# Patient Record
Sex: Female | Born: 1950 | Race: White | Hispanic: No | Marital: Married
Health system: Midwestern US, Community
[De-identification: ages and names within clinical notes are randomized; demographics above are authoritative.]

## PROBLEM LIST (undated history)

## (undated) DIAGNOSIS — Z1239 Encounter for other screening for malignant neoplasm of breast: Secondary | ICD-10-CM

## (undated) DIAGNOSIS — R922 Inconclusive mammogram: Secondary | ICD-10-CM

## (undated) DIAGNOSIS — J4520 Mild intermittent asthma, uncomplicated: Secondary | ICD-10-CM

## (undated) DIAGNOSIS — I10 Essential (primary) hypertension: Secondary | ICD-10-CM

## (undated) DIAGNOSIS — Z139 Encounter for screening, unspecified: Secondary | ICD-10-CM

## (undated) DIAGNOSIS — Z13820 Encounter for screening for osteoporosis: Secondary | ICD-10-CM

## (undated) DIAGNOSIS — R0600 Dyspnea, unspecified: Secondary | ICD-10-CM

## (undated) DIAGNOSIS — E7800 Pure hypercholesterolemia, unspecified: Secondary | ICD-10-CM

## (undated) DIAGNOSIS — M810 Age-related osteoporosis without current pathological fracture: Secondary | ICD-10-CM

## (undated) DIAGNOSIS — Z1231 Encounter for screening mammogram for malignant neoplasm of breast: Secondary | ICD-10-CM

## (undated) DIAGNOSIS — F419 Anxiety disorder, unspecified: Secondary | ICD-10-CM

## (undated) DIAGNOSIS — R0602 Shortness of breath: Secondary | ICD-10-CM

## (undated) DIAGNOSIS — R42 Dizziness and giddiness: Secondary | ICD-10-CM

---

## 2013-01-17 ENCOUNTER — Encounter

## 2013-03-11 NOTE — Progress Notes (Signed)
03/11/2013     Patient:  Amy Cervantes   DOB:  [birth year]       CHIEF COMPLAINT:   Chief Complaint   Patient presents with   ??? Hypertension   ??? Irregular Heart Beat         HISTORY OF PRESENT ILLNESS: Amy Cervantes presents for cardiology follow up. Palpitation with near syncopal sensation.  Recently found to have labile hypertension..  Dyslipidemia    PAST MEDICAL HISTORY:  Past Medical History   Diagnosis Date   ??? Hypertension    ??? Hyperlipidemia    ??? Palpitation    ??? Near syncope         PAST SURGICAL HISTORY:  History reviewed. No pertinent past surgical history.    MEDICATIONS:  Current Outpatient Prescriptions   Medication Sig Dispense Refill   ??? Dexlansoprazole (DEXILANT) 60 mg CpDB Take  by mouth.            ALLERGIES:   Allergies   Allergen Reactions   ??? Levaquin [Levofloxacin] Other (comments)   ??? Sulfur Other (comments)       SOCIAL HISTORY:  History     Social History   ??? Marital Status: UNKNOWN     Spouse Name: N/A     Number of Children: N/A   ??? Years of Education: N/A     Social History Main Topics   ??? Smoking status: Former Smoker   ??? Smokeless tobacco: Not on file   ??? Alcohol Use: Not on file   ??? Drug Use: Not on file   ??? Sexually Active: Not on file     Other Topics Concern   ??? Not on file     Social History Narrative   ??? No narrative on file         FAMILY HISTORY:  Family History   Problem Relation Age of Onset   ??? Breast Cancer Paternal Grandmother        REVIEW OF SYSTEMS:  Neuro: No headache, focal weakness/numbness, parasthesias  Eyes: No visual disturbance  ENMT: No soar throat, nasal congestion, hearing loss, tinnitus  CV: No chest pain, palpitations, dizziness, syncope, claudication  Resp: No dyspnea, cough  GI: No abdominal pain, N/V, diarrhea, constipation  GU: No hematuria, dysuria  Skin: No rash  Heme: No bleeding, bruising  Constitutional: No fatigue, weakness, fever, weight change  Musculoskeletal: No myalgias, arthralgias  Other:    PHYSICAL EXAM:  Blood pressure 154/90, pulse 72, height  5\' 2"  (1.575 m), weight 145 lb (65.772 kg), SpO2 95.00%.   General: Well developed, well nourished, no acute distress, appears stated age  Eyes: Anicteric, no hemorrhage, normal appearing conjunctivae and lids  ENMT: Normal externally  Neck: Supple, no JVD, no carotid bruit  CV: RRR, normal S1S2, no M/G/R  Lungs: CTA bilaterally, normal respiratory excursion  Abd: Soft, NT, ND, no palpable mass  Extremities: No pedal edema  Skin: Warm and dry  Musculoskeletal: Normal muscle tone, normal gait  Neuro: Nonfocal exam, no rigidity/tremor  Psych: Alert and oriented, appropriate affect      LABS AND TESTING:  ?? EKG from March 08, 2013 showed regular sinus rhythm, normal EKG  ?? Labs: from 03/08/2013, done at the emergency room showed creatinine 0.69, BUN of 20, potassium 4.3.  Troponin was normal.  Hemoglobin 13.5.  White count was normal.  TSH 1.335, magnesium 1.9  ??     ASSESSMENT AND PLAN:  62 year old physically very active lady with no significant medical issues  after exercising for a prolonged period of time, developed rapid heartbeat about 190 bpm theophylline rapid heartbeat lasted some 20 minutes and she felt lightheaded, near syncopal sensation without actually fainting.  Eventually she ended up in the emergency room for further evaluation and all workup was negative.  I reviewed the records.  There was no chest pain reported.  There was no wheezing reported, and  Her pulse was rregular.    Central get a blood pressure machine and check her blood pressure regularly at home and record its I will also obtain an echocardiogram to evaluate left ventricular function.  Her cardiac exam is unremarkable.  His second blood pressure reading is down to 130/90 and she watches her salt intake and does not take over-the-counter medication.      Warnell Bureau, MD

## 2013-03-11 NOTE — Patient Instructions (Signed)
MyChart Activation    Thank you for requesting access to MyChart. Please follow the instructions below to securely access and download your online medical record. MyChart allows you to send messages to your doctor, view your test results, renew your prescriptions, schedule appointments, and more.    How Do I Sign Up?    1. In your internet browser, go to [email or web address]  2. Click on the First Time User? Click Here link in the Sign In box. You will be redirect to the New Member Sign Up page.  3. Enter your MyChart Access Code exactly as it appears below. You will not need to use this code after you???ve completed the sign-up process. If you do not sign up before the expiration date, you must request a new code.    MyChart Access Code: [email or web address]  Expires: 04/17/2013  1:20 PM (This is the date your MyChart access code will expire)    4. Enter the last four digits of your Social Security Number (xxxx) and Date of Birth (mm/dd/yyyy) as indicated and click Submit. You will be taken to the next sign-up page.  5. Create a MyChart ID. This will be your MyChart login ID and cannot be changed, so think of one that is secure and easy to remember.  6. Create a MyChart password. You can change your password at any time.  7. Enter your Password Reset Question and Answer. This can be used at a later time if you forget your password.   8. Enter your e-mail address. You will receive e-mail notification when new information is available in MyChart.  9. Click Sign Up. You can now view and download portions of your medical record.  10. Click the Download Summary menu link to download a portable copy of your medical information.    Additional Information    If you have questions, please visit the Frequently Asked Questions section of the MyChart website at [email or web address] Remember, MyChart is NOT to be used for urgent needs. For medical emergencies, dial 911.

## 2013-03-18 NOTE — Patient Instructions (Signed)
MyChart Activation    Thank you for requesting access to MyChart. Please follow the instructions below to securely access and download your online medical record. MyChart allows you to send messages to your doctor, view your test results, renew your prescriptions, schedule appointments, and more.    How Do I Sign Up?    1. In your internet browser, go to [email or web address]  2. Click on the First Time User? Click Here link in the Sign In box. You will be redirect to the New Member Sign Up page.  3. Enter your MyChart Access Code exactly as it appears below. You will not need to use this code after you???ve completed the sign-up process. If you do not sign up before the expiration date, you must request a new code.    MyChart Access Code: Not generated  Current MyChart Status: Active (This is the date your MyChart access code will expire)    4. Enter the last four digits of your Social Security Number (xxxx) and Date of Birth (mm/dd/yyyy) as indicated and click Submit. You will be taken to the next sign-up page.  5. Create a MyChart ID. This will be your MyChart login ID and cannot be changed, so think of one that is secure and easy to remember.  6. Create a MyChart password. You can change your password at any time.  7. Enter your Password Reset Question and Answer. This can be used at a later time if you forget your password.   8. Enter your e-mail address. You will receive e-mail notification when new information is available in MyChart.  9. Click Sign Up. You can now view and download portions of your medical record.  10. Click the Download Summary menu link to download a portable copy of your medical information.    Additional Information    If you have questions, please visit the Frequently Asked Questions section of the MyChart website at [email or web address] Remember, MyChart is NOT to be used for urgent needs. For medical emergencies, dial 911.

## 2013-03-18 NOTE — Progress Notes (Signed)
03/18/2013     Patient:  Amy Cervantes   DOB:  1951/02/06       CHIEF COMPLAINT:   Chief Complaint   Patient presents with   ??? Hypertension         HISTORY OF PRESENT ILLNESS: Amy Cervantes presents for cardiology follow up. Labile hypertension, palpitation.denies chest pain, shortness of breath    PAST MEDICAL HISTORY:  Past Medical History   Diagnosis Date   ??? Hypertension    ??? Hyperlipidemia    ??? Palpitation    ??? Near syncope    ??? Asthma    ??? Schatzki's ring      status post ballooning   ??? Gastric ulcer      H. pylori negative        PAST SURGICAL HISTORY:  History reviewed. No pertinent past surgical history.    MEDICATIONS:  Current Outpatient Prescriptions   Medication Sig Dispense Refill   ??? Dexlansoprazole (DEXILANT) 60 mg CpDB Take  by mouth.            ALLERGIES:   Allergies   Allergen Reactions   ??? Levaquin [Levofloxacin] Other (comments)   ??? Sulfur Other (comments)       SOCIAL HISTORY:  History     Social History   ??? Marital Status: UNKNOWN     Spouse Name: N/A     Number of Children: N/A   ??? Years of Education: N/A     Social History Main Topics   ??? Smoking status: Former Smoker     Quit date: 03/11/1993   ??? Smokeless tobacco: Not on file   ??? Alcohol Use: Yes      Comment: occ   ??? Drug Use: Not on file   ??? Sexually Active: Not on file     Other Topics Concern   ??? Not on file     Social History Narrative   ??? No narrative on file         FAMILY HISTORY:  Family History   Problem Relation Age of Onset   ??? Breast Cancer Paternal Grandmother    ??? Hypertension Father    ??? Heart Attack Father        REVIEW OF SYSTEMS:  Neuro: No headache, focal weakness/numbness, parasthesias  Eyes: No visual disturbance  ENMT: No soar throat, nasal congestion, hearing loss, tinnitus  CV: No chest pain, palpitations, dizziness, syncope, claudication  Resp: No dyspnea, cough  GI: No abdominal pain, N/V, diarrhea, constipation  GU: No hematuria, dysuria  Skin: No rash  Heme: No bleeding, bruising  Constitutional: No fatigue,  weakness, fever, weight change  Musculoskeletal: No myalgias, arthralgias  Other:    PHYSICAL EXAM:  Blood pressure 120/80, pulse 85, height 5\' 2"  (1.575 m), weight 144 lb (65.318 kg), SpO2 97.00%.   General: Well developed, well nourished, no acute distress, appears stated age  Eyes: Anicteric, no hemorrhage, normal appearing conjunctivae and lids  ENMT: Normal externally  Neck: Supple, no JVD, no carotid bruit  CV: RRR, normal S1S2, no M/G/R  Lungs: CTA bilaterally, normal respiratory excursion  Abd: Soft, NT, ND, no palpable mass  Extremities: No pedal edema  Skin: Warm and dry  Musculoskeletal: Normal muscle tone, normal gait  Neuro: Nonfocal exam, no rigidity/tremor  Psych: Alert and oriented, appropriate affect      LABS AND TESTING:    ?? Labs: noted and reviewed  ?? Echocardiogram reviewed.  No evidence of MVP.  Mildly dilated left atrium  ASSESSMENT AND PLAN:    62 year old lady with labile hypertension and SVT.  We'll start her on low dose bystolic at 2.5 mg daily and if she tolerates it, we will increase it to 5 mg daily    Warnell Bureau, MD

## 2013-06-20 NOTE — Progress Notes (Signed)
06/20/2013     Patient:  Amy Cervantes   DOB:  1950-07-17       CHIEF COMPLAINT:   Chief Complaint   Patient presents with   ??? Hypertension         HISTORY OF PRESENT ILLNESS: Amy Cervantes presents for cardiology follow up. Labile hypertension  Associated with near syncopal sensation, and palpitation    PAST MEDICAL HISTORY:  Past Medical History   Diagnosis Date   ??? Hypertension    ??? Hyperlipidemia    ??? Palpitation    ??? Near syncope    ??? Asthma    ??? Schatzki's ring      status post ballooning   ??? Gastric ulcer      H. pylori negative        PAST SURGICAL HISTORY:  No past surgical history on file.    MEDICATIONS:  Current Outpatient Prescriptions   Medication Sig Dispense Refill   ??? losartan-hydrochlorothiazide (HYZAAR) 50-12.5 mg per tablet Take 1 tablet by mouth daily.       ??? Dexlansoprazole (DEXILANT) 60 mg CpDB Take  by mouth.            ALLERGIES:   Allergies   Allergen Reactions   ??? Levaquin [Levofloxacin] Other (comments)   ??? Sulfur Other (comments)       SOCIAL HISTORY:  History     Social History   ??? Marital Status: UNKNOWN     Spouse Name: N/A     Number of Children: N/A   ??? Years of Education: N/A     Social History Main Topics   ??? Smoking status: Former Smoker     Quit date: 03/11/1993   ??? Smokeless tobacco: Not on file   ??? Alcohol Use: Yes      Comment: occ   ??? Drug Use: Not on file   ??? Sexually Active: Not on file     Other Topics Concern   ??? Not on file     Social History Narrative   ??? No narrative on file         FAMILY HISTORY:  Family History   Problem Relation Age of Onset   ??? Breast Cancer Paternal Grandmother    ??? Hypertension Father    ??? Heart Attack Father        REVIEW OF SYSTEMS:  Neuro: No headache, focal weakness/numbness, parasthesias  Eyes: No visual disturbance  ENMT: No soar throat, nasal congestion, hearing loss, tinnitus  CV: No chest pain, palpitations, dizziness, syncope, claudication  Resp: No dyspnea, cough  GI: No abdominal pain, N/V, diarrhea, constipation  GU: No hematuria,  dysuria  Skin: No rash  Heme: No bleeding, bruising  Constitutional: No fatigue, weakness, fever, weight change  Musculoskeletal: No myalgias, arthralgias  Other:    PHYSICAL EXAM:  Blood pressure 112/80, pulse 68, height 5\' 2"  (1.575 m), weight 144 lb (65.318 kg), SpO2 99.00%.   General: Well developed, well nourished, no acute distress, appears stated age  Eyes: Anicteric, no hemorrhage, normal appearing conjunctivae and lids  ENMT: Normal externally  Neck: Supple, no JVD, no carotid bruit  CV: RRR, normal S1S2, no M/G/R  Lungs: CTA bilaterally, normal respiratory excursion  Abd: Soft, NT, ND, no palpable mass  Extremities: No pedal edema  Skin: Warm and dry  Musculoskeletal: Normal muscle tone, normal gait  Neuro: Nonfocal exam, no rigidity/tremor  Psych: Alert and oriented, appropriate affect      LABS AND TESTING:    ??  Labs: potassium 4.5, BUS and 24, creatinine 0.75.  Liver function tests are normal.  HDL is 95, LDL is 94.  Hemoglobin is 14.1, hematocrit 43.9  ??     ASSESSMENT AND PLAN:    63 year old lady with  Sjogren's level and hypertension, SVT, will put her back on Toprol 25 mg daily follow-up saying a month with blood pressure readings    Warnell BureauHratch K Kenzly Rogoff, MD

## 2013-06-20 NOTE — Patient Instructions (Signed)
MyChart Activation    Thank you for requesting access to MyChart. Please follow the instructions below to securely access and download your online medical record. MyChart allows you to send messages to your doctor, view your test results, renew your prescriptions, schedule appointments, and more.    How Do I Sign Up?    1. In your internet browser, go to [email or web address]  2. Click on the First Time User? Click Here link in the Sign In box. You will be redirect to the New Member Sign Up page.  3. Enter your MyChart Access Code exactly as it appears below. You will not need to use this code after you???ve completed the sign-up process. If you do not sign up before the expiration date, you must request a new code.    MyChart Access Code: Not generated  Current MyChart Status: Active (This is the date your MyChart access code will expire)    4. Enter the last four digits of your Social Security Number (xxxx) and Date of Birth (mm/dd/yyyy) as indicated and click Submit. You will be taken to the next sign-up page.  5. Create a MyChart ID. This will be your MyChart login ID and cannot be changed, so think of one that is secure and easy to remember.  6. Create a MyChart password. You can change your password at any time.  7. Enter your Password Reset Question and Answer. This can be used at a later time if you forget your password.   8. Enter your e-mail address. You will receive e-mail notification when new information is available in MyChart.  9. Click Sign Up. You can now view and download portions of your medical record.  10. Click the Download Summary menu link to download a portable copy of your medical information.    Additional Information    If you have questions, please visit the Frequently Asked Questions section of the MyChart website at [email or web address] Remember, MyChart is NOT to be used for urgent needs. For medical emergencies, dial 911.

## 2013-06-20 NOTE — Addendum Note (Signed)
Addended by: Barrett HenleKAZANJIAN, Elery Cadenhead K. on: 06/20/2013 03:21 PM     Modules accepted: Orders, Medications

## 2013-07-18 NOTE — Progress Notes (Signed)
07/18/2013     Patient:  Amy Cervantes   DOB:  1951-04-13       CHIEF COMPLAINT:   Chief Complaint   Patient presents with   ??? Hypertension         HISTORY OF PRESENT ILLNESS: Amy StanfordSusan Reinheimer presents for cardiology follow up. Palpitation.  Denies chest pain    PAST MEDICAL HISTORY:  Past Medical History   Diagnosis Date   ??? Hypertension    ??? Hyperlipidemia    ??? Palpitation    ??? Near syncope    ??? Asthma    ??? Schatzki's ring      status post ballooning   ??? Gastric ulcer      H. pylori negative        PAST SURGICAL HISTORY:  No past surgical history on file.    MEDICATIONS:  Current Outpatient Prescriptions   Medication Sig Dispense Refill   ??? metoprolol succinate (TOPROL-XL) 25 mg XL tablet Take 1 tablet by mouth daily.  30 tablet  3   ??? Dexlansoprazole (DEXILANT) 60 mg CpDB Take  by mouth.            ALLERGIES:   Allergies   Allergen Reactions   ??? Levaquin [Levofloxacin] Other (comments)   ??? Sulfur Other (comments)       SOCIAL HISTORY:  History     Social History   ??? Marital Status: UNKNOWN     Spouse Name: N/A     Number of Children: N/A   ??? Years of Education: N/A     Social History Main Topics   ??? Smoking status: Former Smoker     Quit date: 03/11/1993   ??? Smokeless tobacco: Not on file   ??? Alcohol Use: Yes      Comment: occ   ??? Drug Use: Not on file   ??? Sexual Activity: Not on file     Other Topics Concern   ??? Not on file     Social History Narrative   ??? No narrative on file         FAMILY HISTORY:  Family History   Problem Relation Age of Onset   ??? Breast Cancer Paternal Grandmother    ??? Hypertension Father    ??? Heart Attack Father        REVIEW OF SYSTEMS:  Neuro: No headache, focal weakness/numbness, parasthesias  Eyes: No visual disturbance  ENMT: No soar throat, nasal congestion, hearing loss, tinnitus  CV: No chest pain, palpitations, dizziness, syncope, claudication  Resp: No dyspnea, cough  GI: No abdominal pain, N/V, diarrhea, constipation  GU: No hematuria, dysuria  Skin: No rash  Heme: No bleeding, bruising   Constitutional: No fatigue, weakness, fever, weight change  Musculoskeletal: No myalgias, arthralgias  Other:    PHYSICAL EXAM:  Blood pressure 110/80, pulse 60, height 5\' 2"  (1.575 m), weight 144 lb (65.318 kg), SpO2 96 %.   General: Well developed, well nourished, no acute distress, appears stated age  Eyes: Anicteric, no hemorrhage, normal appearing conjunctivae and lids  ENMT: Normal externally  Neck: Supple, no JVD, no carotid bruit  CV: RRR, normal S1S2, no M/G/R  Lungs: CTA bilaterally, normal respiratory excursion  Abd: Soft, NT, ND, no palpable mass  Extremities: No pedal edema  Skin: Warm and dry  Musculoskeletal: Normal muscle tone, normal gait  Neuro: Nonfocal exam, no rigidity/tremor  Psych: Alert and oriented, appropriate affect      LABS AND TESTING:  ??   ??   ??  ASSESSMENT AND PLAN:    63 year old lady with palpitation and labile hypertension even on beta blocker.  She cannot tolerate beta blocker at higher doses.  I suggested for her to get a loop monitor  For at least 2 weeks.    Warnell Bureau, MD

## 2013-07-18 NOTE — Patient Instructions (Signed)
MyChart Activation    Thank you for requesting access to MyChart. Please follow the instructions below to securely access and download your online medical record. MyChart allows you to send messages to your doctor, view your test results, renew your prescriptions, schedule appointments, and more.    How Do I Sign Up?    1. In your internet browser, go to [email or web address]  2. Click on the First Time User? Click Here link in the Sign In box. You will be redirect to the New Member Sign Up page.  3. Enter your MyChart Access Code exactly as it appears below. You will not need to use this code after you???ve completed the sign-up process. If you do not sign up before the expiration date, you must request a new code.    MyChart Access Code: Activation code not generated  Current MyChart Status: Active (This is the date your MyChart access code will expire)    4. Enter the last four digits of your Social Security Number (xxxx) and Date of Birth (mm/dd/yyyy) as indicated and click Submit. You will be taken to the next sign-up page.  5. Create a MyChart ID. This will be your MyChart login ID and cannot be changed, so think of one that is secure and easy to remember.  6. Create a MyChart password. You can change your password at any time.  7. Enter your Password Reset Question and Answer. This can be used at a later time if you forget your password.   8. Enter your e-mail address. You will receive e-mail notification when new information is available in MyChart.  9. Click Sign Up. You can now view and download portions of your medical record.  10. Click the Download Summary menu link to download a portable copy of your medical information.    Additional Information    If you have questions, please visit the Frequently Asked Questions section of the MyChart website at [email or web address] Remember, MyChart is NOT to be used for urgent needs. For medical emergencies, dial 911.

## 2013-08-21 MED ORDER — IBUPROFEN 800 MG TAB
800 mg | ORAL_TABLET | Freq: Four times a day (QID) | ORAL | Status: AC | PRN
Start: 2013-08-21 — End: 2013-08-28

## 2013-08-21 NOTE — ED Notes (Signed)
Pt fitted for crutches and demonstrates abil to amb with crutches at discharge

## 2013-08-21 NOTE — ED Notes (Signed)
Pt sts she stepped onto [place] this morning and slipped and fell  injuruying left ankle and right hip   Pt refused w/c  Placed room 6   Ice pack applied to ankle   Pt denies head or neck injury    Pt is wearing a heart monitor at this time

## 2013-08-21 NOTE — ED Provider Notes (Signed)
HPI Comments: 63 yo female with left lateral ankle swelling and tenderness as well as moderately severe tenderness on palpation of left hip after foot rolled over pebble causing her to invert her left ankle, fall and landed directly on right hip.  Denies any pelvis or left hip pain, no neck or acute back pain.  Is FWB and ambulatory although with discomfort.      Patient is a 63 y.o. female presenting with ankle problem and hip pain. The history is provided by the patient.   Ankle Injury   This is a new problem. The current episode started less than 1 hour ago. The problem occurs constantly. The problem has been gradually worsening. The pain is present in the left ankle and right hip. The quality of the pain is described as aching. The pain is moderate. Associated symptoms include stiffness. Pertinent negatives include no numbness, full range of motion, no back pain and no neck pain. The symptoms are aggravated by contact, movement and palpation. She has tried nothing for the symptoms. There has been a history of trauma.   Hip Injury   Associated symptoms include stiffness. Pertinent negatives include no numbness, full range of motion, no back pain and no neck pain.        Past Medical History   Diagnosis Date   ??? Hypertension    ??? Hyperlipidemia    ??? Palpitation    ??? Near syncope    ??? Asthma    ??? Schatzki's ring      status post ballooning   ??? Gastric ulcer      H. pylori negative        History reviewed. No pertinent past surgical history.      Family History   Problem Relation Age of Onset   ??? Breast Cancer Paternal Grandmother    ??? Hypertension Father    ??? Heart Attack Father         History     Social History   ??? Marital Status: UNKNOWN     Spouse Name: N/A     Number of Children: N/A   ??? Years of Education: N/A     Occupational History   ??? Not on file.     Social History Main Topics   ??? Smoking status: Former Smoker     Quit date: 03/11/1993   ??? Smokeless tobacco: Not on file   ??? Alcohol Use: Yes      Comment:  occ   ??? Drug Use: Not on file   ??? Sexual Activity: Not on file     Other Topics Concern   ??? Not on file     Social History Narrative                  ALLERGIES: Levaquin and Sulfur      Review of Systems   Constitutional: Negative.    HENT: Negative.    Respiratory: Negative.    Musculoskeletal: Positive for arthralgias, gait problem and stiffness. Negative for back pain, joint swelling, neck pain and neck stiffness.   Skin: Negative for color change and wound.   Neurological: Negative.  Negative for numbness.   All other systems reviewed and are negative.      Filed Vitals:    08/21/13 1129   BP: 122/76   Pulse: 74   Temp: 98 ??F (36.7 ??C)   Resp: 14   Height: 5\' 2"  (1.575 m)   Weight: 66.679 kg (147 lb)   SpO2:  98%            Physical Exam   Constitutional: She appears well-developed and well-nourished. No distress.   HENT:   Head: Normocephalic and atraumatic.   Neck: Normal range of motion. Neck supple.   Pulmonary/Chest: Effort normal.   Musculoskeletal: She exhibits tenderness. She exhibits no edema.        Right hip: She exhibits decreased range of motion, tenderness and bony tenderness. She exhibits no swelling, no crepitus, no deformity and no laceration.        Left hip: Normal.        Right knee: Normal.        Left knee: Normal.        Right ankle: Normal.        Left ankle: She exhibits decreased range of motion and swelling. She exhibits no ecchymosis, no deformity, no laceration and normal pulse. Tenderness. Lateral malleolus tenderness found. Achilles tendon normal.        Cervical back: Normal.        Lumbar back: Normal.   Skin: She is not diaphoretic.   Nursing note and vitals reviewed.       MDM  Number of Diagnoses or Management Options  Ankle sprain, left, initial encounter: new and requires workup  Contusion of right hip, initial encounter: new and requires workup  Diagnosis management comments: Imaging of right hip and left ankle   Ace wrap left ankle/foot  Crutches  Instructions  Referral to  ortho            Amount and/or Complexity of Data Reviewed  Tests in the radiology section of CPT??: ordered and reviewed    Risk of Complications, Morbidity, and/or Mortality  Presenting problems: moderate  Diagnostic procedures: moderate  Management options: moderate        Procedures

## 2014-03-26 ENCOUNTER — Encounter

## 2014-04-27 ENCOUNTER — Inpatient Hospital Stay: Admit: 2014-04-27 | Payer: BLUE CROSS/BLUE SHIELD | Attending: Internal Medicine

## 2014-04-27 DIAGNOSIS — M8580 Other specified disorders of bone density and structure, unspecified site: Secondary | ICD-10-CM

## 2014-04-27 DIAGNOSIS — R922 Inconclusive mammogram: Secondary | ICD-10-CM

## 2014-04-29 LAB — HM MAMMOGRAPHY

## 2014-05-01 ENCOUNTER — Encounter

## 2014-05-01 ENCOUNTER — Ambulatory Visit

## 2014-05-01 ENCOUNTER — Inpatient Hospital Stay: Admit: 2014-05-01 | Payer: BLUE CROSS/BLUE SHIELD

## 2014-05-01 DIAGNOSIS — R0602 Shortness of breath: Secondary | ICD-10-CM

## 2014-05-05 ENCOUNTER — Encounter

## 2014-05-08 ENCOUNTER — Inpatient Hospital Stay: Admit: 2014-05-08 | Payer: BLUE CROSS/BLUE SHIELD | Attending: Internal Medicine

## 2014-05-08 DIAGNOSIS — R0602 Shortness of breath: Secondary | ICD-10-CM

## 2014-05-08 NOTE — Progress Notes (Signed)
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  Pt will reschedule test and take pain medication prior to testing.

## 2015-01-01 ENCOUNTER — Encounter: Admit: 2015-01-01 | Discharge: 2015-01-01 | Payer: PRIVATE HEALTH INSURANCE | Attending: Internal Medicine

## 2015-01-15 ENCOUNTER — Encounter: Admit: 2015-01-15 | Discharge: 2015-01-15 | Payer: PRIVATE HEALTH INSURANCE | Attending: Internal Medicine

## 2015-03-26 ENCOUNTER — Encounter: Attending: Internal Medicine

## 2015-03-26 LAB — AMB EXT CREATININE: Creatinine, External: 0.72

## 2015-03-26 LAB — AMB EXT LDL-C: LDL-C, External: 118

## 2015-04-02 ENCOUNTER — Encounter: Admit: 2015-04-02 | Discharge: 2015-04-02 | Payer: PRIVATE HEALTH INSURANCE | Attending: Internal Medicine

## 2015-05-10 MED ORDER — DEXILANT 60 MG CAPSULE, DELAYED RELEASE
60 mg | ORAL_CAPSULE | ORAL | 3 refills | Status: DC
Start: 2015-05-10 — End: 2015-10-13

## 2015-05-10 MED ORDER — METOPROLOL SUCCINATE SR 25 MG 24 HR TAB
25 mg | ORAL_TABLET | ORAL | 3 refills | Status: DC
Start: 2015-05-10 — End: 2015-10-13

## 2015-05-17 MED ORDER — AZELASTINE 137 MCG NASAL SPRAY AEROSOL
137 mcg (0.1 %) | NASAL | 1 refills | Status: AC
Start: 2015-05-17 — End: ?

## 2015-05-28 ENCOUNTER — Ambulatory Visit: Admit: 2015-05-28 | Discharge: 2015-05-28 | Payer: MEDICARE | Attending: Internal Medicine

## 2015-05-28 DIAGNOSIS — Z139 Encounter for screening, unspecified: Secondary | ICD-10-CM

## 2015-05-28 MED ORDER — METHYLPREDNISOLONE 4 MG TABS IN A DOSE PACK
4 mg | ORAL | 0 refills | Status: AC
Start: 2015-05-28 — End: ?

## 2015-05-28 MED ORDER — AMOXICILLIN CLAVULANATE 875 MG-125 MG TAB
875-125 mg | ORAL_TABLET | Freq: Two times a day (BID) | ORAL | 0 refills | Status: AC
Start: 2015-05-28 — End: ?

## 2015-05-28 MED ORDER — PROMETHAZINE-CODEINE 6.25 MG-10 MG/5 ML SYRUP
Freq: Four times a day (QID) | ORAL | 0 refills | Status: AC | PRN
Start: 2015-05-28 — End: ?

## 2015-05-28 NOTE — Progress Notes (Signed)
HISTORY OF PRESENT ILLNESS  Amy Cervantes is a 64 y.o. female patient with a history of multiple medical problems presented to G history of cough wheeze, low-grade fever  No chest pain abdominal pain or changes in urination or changes in mental status        Allergies   Allergen Reactions   ??? Iodinated Contrast Media - Oral And Iv Dye Unknown (comments)   ??? Levaquin [Levofloxacin] Palpitations   ??? Sulfur Other (comments)     Social History     Social History   ??? Marital status: MARRIED     Spouse name: N/A   ??? Number of children: N/A   ??? Years of education: N/A     Occupational History   ??? Not on file.     Social History Main Topics   ??? Smoking status: Former Smoker     Quit date: 03/11/1993   ??? Smokeless tobacco: Not on file   ??? Alcohol use Yes      Comment: occ   ??? Drug use: No   ??? Sexual activity: Not on file     Other Topics Concern   ??? Not on file     Social History Narrative     Past Medical History   Diagnosis Date   ??? Anxiety    ??? Asthma    ??? Gastric ulcer      H. pylori negative   ??? GERD (gastroesophageal reflux disease)    ??? Hiatal hernia    ??? Hyperlipidemia    ??? Hypertension    ??? Insomnia    ??? Low back pain    ??? Near syncope    ??? Osteoporosis    ??? Palpitation    ??? Schatzki's ring      status post ballooning     Past Surgical History   Procedure Laterality Date   ??? Hx colonoscopy     ??? Hx endoscopy     ??? Hx heent       deviated septum surgery   ??? Hx gyn       tubal ligation   ??? Hx orthopaedic       toe surgery     Current Outpatient Prescriptions   Medication Sig Dispense Refill   ??? lisinopril (PRINIVIL, ZESTRIL) 10 mg tablet Take  by mouth daily.     ??? ALPRAZolam (XANAX) 0.5 mg tablet Take  by mouth.     ??? amoxicillin-clavulanate (AUGMENTIN) 875-125 mg per tablet Take 1 Tab by mouth every twelve (12) hours. 14 Tab 0   ??? methylPREDNISolone (MEDROL DOSEPACK) 4 mg tablet As directed on pack 1 Dose Pack 0   ??? promethazine-codeine (PHENERGAN WITH CODEINE) 6.25-10 mg/5 mL syrup Take  5 mL by mouth four (4) times daily as needed for Cough. Max Daily Amount: 20 mL. 120 mL 0   ??? azelastine (ASTELIN) 137 mcg (0.1 %) nasal spray INHALE TWO SPRAYS IN EACH NOSTRIL TWICE DAILY 1 Bottle 1   ??? DEXILANT 60 mg CpDB TAKE 1 CAPSULE EVERY DAY 30 Cap 3   ??? metoprolol succinate (TOPROL-XL) 25 mg XL tablet TAKE 1 TABLET EVERY DAY 30 Tab 3       ROS  Review of Systems   Constitutional: Negative for chills, diaphoresis, fever, malaise/fatigue and weight loss.   HENT: Negative for ear discharge, ear pain and nosebleeds.    Eyes: Negative for blurred vision, double vision, photophobia, discharge and redness.   Respiratory: Positive for cough and sputum production.  Negative for hemoptysis and shortness of breath.    Cardiovascular: Negative for chest pain, palpitations, orthopnea, claudication and leg swelling.   Gastrointestinal: Negative for abdominal pain, blood in stool, constipation, diarrhea, heartburn, melena, nausea and vomiting.   Genitourinary: Negative for dysuria, frequency, hematuria and urgency.   Musculoskeletal: Negative for back pain, joint pain, myalgias and neck pain.   Skin: Negative for itching and rash.   Neurological: Negative for dizziness, tingling, tremors, sensory change, speech change, seizures, loss of consciousness, weakness and headaches.   Psychiatric/Behavioral: Negative for depression, hallucinations, memory loss, substance abuse and suicidal ideas.       Physical Exam  Visit Vitals   ??? BP 130/70   ??? Pulse 88   ??? Temp 98.2 ??F (36.8 ??C) (Oral)   ??? Ht  (1.575 m)       Physical Exam   Constitutional: She is oriented to person, place, and time and well-developed, well-nourished, and in no distress. No distress.   HENT:   Head: Normocephalic and atraumatic.   Right Ear: External ear normal.   Left Ear: External ear normal.   Nose: Nose normal.   Mouth/Throat: Oropharynx is clear and moist.   Eyes: Conjunctivae and EOM are normal. Pupils are equal, round, and  reactive to light. Right eye exhibits no discharge. Left eye exhibits no discharge.   Neck: Normal range of motion. Neck supple. No JVD present. No tracheal deviation present. No thyromegaly present.   Cardiovascular: Normal rate, regular rhythm, normal heart sounds and intact distal pulses.  Exam reveals no gallop and no friction rub.    No murmur heard.  Pulmonary/Chest: Effort normal and breath sounds normal. No stridor. No respiratory distress. She has no wheezes. She has no rales. She exhibits no tenderness.   Abdominal: She exhibits no distension and no mass. There is no tenderness. There is no rebound and no guarding.   Musculoskeletal: Normal range of motion. She exhibits no edema, tenderness or deformity.   Lymphadenopathy:     She has no cervical adenopathy.   Neurological: She is alert and oriented to person, place, and time. She has normal reflexes. No cranial nerve deficit. She exhibits normal muscle tone. Gait normal. Coordination normal.   Skin: Skin is warm and dry. No rash noted. No erythema.   Psychiatric: Mood, affect and judgment normal.         ASSESSMENT Roosvelt Harps    ICD-10-CM ICD-9-CM    1. Screening Z13.9 V82.9 MAM MAMMO BI SCREENING DIGTL      US BREAST BI COMPLETE      DEXA,BONE DENSITY,AXIAL SKELETON      CANCELED: DEXA,BONE DENSITY,AXIAL SKELETON   2. Essential hypertension I10 401.9    3. Schatzki's ring K22.2 750.3    4. Acute bronchitis, unspecified organism J20.9 466.0    5. Pure hypercholesterolemia E78.00 272.0      Patient history of multiple medical problems as entities cough wheeze productive sputum rate temperature  Medrol Dosepak, albuterol inhaler Augmentin for 7 days 750 mg twice a day

## 2015-06-04 ENCOUNTER — Encounter

## 2015-07-02 ENCOUNTER — Ambulatory Visit: Admit: 2015-07-02 | Discharge: 2015-07-02 | Payer: MEDICARE | Attending: Internal Medicine

## 2015-07-02 DIAGNOSIS — N390 Urinary tract infection, site not specified: Secondary | ICD-10-CM

## 2015-07-02 MED ORDER — ALPRAZOLAM 0.5 MG TAB
0.5 mg | ORAL_TABLET | Freq: Every evening | ORAL | 0 refills | Status: DC | PRN
Start: 2015-07-02 — End: 2015-12-17

## 2015-07-02 NOTE — Progress Notes (Signed)
HISTORY OF PRESENT ILLNESS  Amy Cervantes is a 65 y.o. female patient with history multiple medical problems above at present time doing well no evidence of chest pain shortness of breath abdominal pain palpitations no changes in mental status  No evidence of any neurological deficiency        Allergies   Allergen Reactions   ??? Iodinated Contrast Media - Oral And Iv Dye Unknown (comments)   ??? Levaquin [Levofloxacin] Palpitations   ??? Sulfur Other (comments)     Social History     Social History   ??? Marital status: MARRIED     Spouse name: N/A   ??? Number of children: N/A   ??? Years of education: N/A     Occupational History   ??? Not on file.     Social History Main Topics   ??? Smoking status: Former Smoker     Quit date: 03/11/1993   ??? Smokeless tobacco: Not on file   ??? Alcohol use Yes      Comment: occ   ??? Drug use: No   ??? Sexual activity: Not on file     Other Topics Concern   ??? Not on file     Social History Narrative     Past Medical History   Diagnosis Date   ??? Anxiety    ??? Asthma    ??? Gastric ulcer      H. pylori negative   ??? GERD (gastroesophageal reflux disease)    ??? Hiatal hernia    ??? Hyperlipidemia    ??? Hypertension    ??? Insomnia    ??? Low back pain    ??? Near syncope    ??? Osteoporosis    ??? Palpitation    ??? Schatzki's ring      status post ballooning     Past Surgical History   Procedure Laterality Date   ??? Hx colonoscopy     ??? Hx endoscopy     ??? Hx heent       deviated septum surgery   ??? Hx gyn       tubal ligation   ??? Hx orthopaedic       toe surgery     Current Outpatient Prescriptions   Medication Sig Dispense Refill   ??? ALPRAZolam (XANAX) 0.5 mg tablet Take 1 Tab by mouth nightly as needed for Anxiety. Max Daily Amount: 0.5 mg. 30 Tab 0   ??? lisinopril (PRINIVIL, ZESTRIL) 10 mg tablet Take 10 mg by mouth two (2) times a day.     ??? amoxicillin-clavulanate (AUGMENTIN) 875-125 mg per tablet Take 1 Tab by mouth every twelve (12) hours. 14 Tab 0    ??? methylPREDNISolone (MEDROL DOSEPACK) 4 mg tablet As directed on pack 1 Dose Pack 0   ??? promethazine-codeine (PHENERGAN WITH CODEINE) 6.25-10 mg/5 mL syrup Take 5 mL by mouth four (4) times daily as needed for Cough. Max Daily Amount: 20 mL. 120 mL 0   ??? azelastine (ASTELIN) 137 mcg (0.1 %) nasal spray INHALE TWO SPRAYS IN EACH NOSTRIL TWICE DAILY 1 Bottle 1   ??? DEXILANT 60 mg CpDB TAKE 1 CAPSULE EVERY DAY 30 Cap 3   ??? metoprolol succinate (TOPROL-XL) 25 mg XL tablet TAKE 1 TABLET EVERY DAY 30 Tab 3       ROS  Review of Systems   Constitutional: Negative for chills, diaphoresis, fever, malaise/fatigue and weight loss.   HENT: Negative for ear discharge, ear pain and nosebleeds.  Eyes: Negative for blurred vision, double vision, photophobia, discharge and redness.   Respiratory: Negative for cough, hemoptysis, sputum production and shortness of breath.    Cardiovascular: Negative for chest pain, palpitations, orthopnea, claudication and leg swelling.   Gastrointestinal: Negative for abdominal pain, blood in stool, constipation, diarrhea, heartburn, melena, nausea and vomiting.   Genitourinary: Negative for dysuria, frequency, hematuria and urgency.   Musculoskeletal: Negative for back pain, joint pain, myalgias and neck pain.   Skin: Negative for itching and rash.   Neurological: Negative for dizziness, tingling, tremors, sensory change, speech change, seizures, loss of consciousness, weakness and headaches.   Psychiatric/Behavioral: Negative for depression, hallucinations, memory loss, substance abuse and suicidal ideas.       Physical Exam  Visit Vitals   ??? BP 130/90   ??? Pulse 68       Physical Exam   Constitutional: She is oriented to person, place, and time and well-developed, well-nourished, and in no distress. No distress.   HENT:   Head: Normocephalic and atraumatic.   Right Ear: External ear normal.   Left Ear: External ear normal.   Nose: Nose normal.   Mouth/Throat: Oropharynx is clear and moist.    Eyes: Conjunctivae and EOM are normal. Pupils are equal, round, and reactive to light. Right eye exhibits no discharge. Left eye exhibits no discharge.   Neck: Normal range of motion. Neck supple. No JVD present. No tracheal deviation present. No thyromegaly present.   Cardiovascular: Normal rate, regular rhythm, normal heart sounds and intact distal pulses.  Exam reveals no gallop and no friction rub.    No murmur heard.  Pulmonary/Chest: Effort normal and breath sounds normal. No stridor. No respiratory distress. She has no wheezes. She has no rales. She exhibits no tenderness.   Abdominal: She exhibits no distension and no mass. There is no tenderness. There is no rebound and no guarding.   Musculoskeletal: Normal range of motion. She exhibits no edema, tenderness or deformity.   Lymphadenopathy:     She has no cervical adenopathy.   Neurological: She is alert and oriented to person, place, and time. She has normal reflexes. No cranial nerve deficit. She exhibits normal muscle tone. Gait normal. Coordination normal.   Skin: Skin is warm and dry. No rash noted. No erythema.   Psychiatric: Mood, affect and judgment normal.         ASSESSMENT Roosvelt Harps    ICD-10-CM ICD-9-CM    1. Urinary tract infection, site unspecified N39.0  URINALYSIS W/MICROSCOPIC      CULTURE, URINE   2. Pure hypercholesterolemia E78.00 272.0 LIPID PANEL      METABOLIC PANEL, COMPREHENSIVE   3. Essential hypertension I10 401.9    4. Schatzki's ring K22.2 750.3    5. Mild intermittent asthma without complication J45.20 493.90    6. History of low back pain Z87.39 V13.59      Patient was seen and evaluated at present time she is doing well no evidence of major problems or distress.  Given to patient repeat urinalysis and CBC

## 2015-07-09 ENCOUNTER — Ambulatory Visit: Payer: MEDICARE

## 2015-08-11 ENCOUNTER — Encounter

## 2015-08-13 ENCOUNTER — Ambulatory Visit: Payer: MEDICARE

## 2015-10-13 MED ORDER — DEXLANSOPRAZOLE 60 MG CAPSULE,BIPHASE DELAYED RELEASE
60 mg | ORAL_CAPSULE | ORAL | 3 refills | Status: DC
Start: 2015-10-13 — End: 2016-02-11

## 2015-10-13 MED ORDER — METOPROLOL SUCCINATE SR 25 MG 24 HR TAB
25 mg | ORAL_TABLET | ORAL | 3 refills | Status: DC
Start: 2015-10-13 — End: 2016-02-11

## 2015-11-12 ENCOUNTER — Encounter: Attending: Internal Medicine

## 2015-11-23 NOTE — Telephone Encounter (Signed)
LMOM to reschedule 11/26/15 appt

## 2015-11-26 ENCOUNTER — Encounter: Attending: Internal Medicine

## 2015-12-17 ENCOUNTER — Inpatient Hospital Stay: Admit: 2015-12-17 | Payer: MEDICARE | Attending: Internal Medicine

## 2015-12-17 ENCOUNTER — Ambulatory Visit: Admit: 2015-12-17 | Discharge: 2015-12-17 | Payer: MEDICARE | Attending: Internal Medicine

## 2015-12-17 DIAGNOSIS — Z1231 Encounter for screening mammogram for malignant neoplasm of breast: Secondary | ICD-10-CM

## 2015-12-17 DIAGNOSIS — I10 Essential (primary) hypertension: Secondary | ICD-10-CM

## 2015-12-17 DIAGNOSIS — R922 Inconclusive mammogram: Secondary | ICD-10-CM

## 2015-12-17 MED ORDER — ROSUVASTATIN 5 MG TAB
5 mg | ORAL_TABLET | Freq: Every evening | ORAL | 3 refills | Status: DC
Start: 2015-12-17 — End: 2016-12-08

## 2015-12-17 MED ORDER — ALPRAZOLAM 0.5 MG TAB
0.5 mg | ORAL_TABLET | Freq: Every evening | ORAL | 0 refills | Status: DC | PRN
Start: 2015-12-17 — End: 2016-05-05

## 2015-12-17 NOTE — Progress Notes (Signed)
HISTORY OF PRESENT ILLNESS  Amy Cervantes is a 65 y.o. female patient is a history multiple medical problems present time doing well had this problem with back pain she was seen by orthopedic surgeon   And problems with sinus was found losing hearing no complaint of chest pain shortness of breath abdominal pain change in mental status  No evidence of acute neurologic deficiencies        Allergies   Allergen Reactions   ??? Iodinated Contrast- Oral And Iv Dye Unknown (comments)   ??? Levaquin [Levofloxacin] Palpitations   ??? Sulfur Other (comments)     Social History     Social History   ??? Marital status: MARRIED     Spouse name: N/A   ??? Number of children: N/A   ??? Years of education: N/A     Occupational History   ??? Not on file.     Social History Main Topics   ??? Smoking status: Former Smoker     Quit date: 03/11/1993   ??? Smokeless tobacco: Never Used   ??? Alcohol use Yes      Comment: occ   ??? Drug use: No   ??? Sexual activity: Not on file     Other Topics Concern   ??? Not on file     Social History Narrative     Past Medical History:   Diagnosis Date   ??? Anxiety    ??? Asthma    ??? Gastric ulcer     H. pylori negative   ??? GERD (gastroesophageal reflux disease)    ??? Hiatal hernia    ??? Hyperlipidemia    ??? Hypertension    ??? Insomnia    ??? Low back pain    ??? Near syncope    ??? Osteoporosis    ??? Palpitation    ??? Schatzki's ring     status post ballooning     Past Surgical History:   Procedure Laterality Date   ??? HX COLONOSCOPY     ??? HX ENDOSCOPY     ??? HX GYN      tubal ligation   ??? HX HEENT      deviated septum surgery   ??? HX ORTHOPAEDIC      toe surgery     Current Outpatient Prescriptions   Medication Sig Dispense Refill   ??? ALPRAZolam (XANAX) 0.5 mg tablet Take 1 Tab by mouth nightly as needed for Anxiety. Max Daily Amount: 0.5 mg. 30 Tab 0   ??? rosuvastatin (CRESTOR) 5 mg tablet Take 1 Tab by mouth nightly. 30 Tab 3   ??? Dexlansoprazole (DEXILANT) 60 mg CpDB TAKE 1 CAPSULE EVERY DAY 30 Cap 3    ??? metoprolol succinate (TOPROL-XL) 25 mg XL tablet TAKE 1 TABLET EVERY DAY 30 Tab 3   ??? lisinopril (PRINIVIL, ZESTRIL) 10 mg tablet Take 10 mg by mouth two (2) times a day.     ??? azelastine (ASTELIN) 137 mcg (0.1 %) nasal spray INHALE TWO SPRAYS IN EACH NOSTRIL TWICE DAILY 1 Bottle 1   ??? amoxicillin-clavulanate (AUGMENTIN) 875-125 mg per tablet Take 1 Tab by mouth every twelve (12) hours. 14 Tab 0   ??? methylPREDNISolone (MEDROL DOSEPACK) 4 mg tablet As directed on pack 1 Dose Pack 0   ??? promethazine-codeine (PHENERGAN WITH CODEINE) 6.25-10 mg/5 mL syrup Take 5 mL by mouth four (4) times daily as needed for Cough. Max Daily Amount: 20 mL. 120 mL 0       ROS  Review of Systems   Constitutional: Negative for chills, diaphoresis, fever, malaise/fatigue and weight loss.   HENT: Negative for ear discharge, ear pain and nosebleeds.    Eyes: Negative for blurred vision, double vision, photophobia, discharge and redness.   Respiratory: Negative for cough, hemoptysis, sputum production and shortness of breath.    Cardiovascular: Negative for chest pain, palpitations, orthopnea, claudication and leg swelling.   Gastrointestinal: Negative for abdominal pain, blood in stool, constipation, diarrhea, heartburn, melena, nausea and vomiting.   Genitourinary: Negative for dysuria, frequency, hematuria and urgency.   Musculoskeletal: Negative for back pain, joint pain, myalgias and neck pain.   Skin: Negative for itching and rash.   Neurological: Positive for headaches. Negative for dizziness, tingling, tremors, sensory change, speech change, seizures, loss of consciousness and weakness.   Psychiatric/Behavioral: Negative for depression, hallucinations, memory loss, substance abuse and suicidal ideas.       Physical Exam  Visit Vitals   ??? BP 110/80   ??? Pulse 60   ??? Resp 18       Physical Exam   Constitutional: She is oriented to person, place, and time and well-developed, well-nourished, and in no distress. No distress.   HENT:    Head: Normocephalic and atraumatic.   Right Ear: External ear normal.   Left Ear: External ear normal.   Nose: Nose normal.   Mouth/Throat: Oropharynx is clear and moist.   Eyes: Conjunctivae and EOM are normal. Pupils are equal, round, and reactive to light. Right eye exhibits no discharge. Left eye exhibits no discharge.   Neck: Normal range of motion. Neck supple. No JVD present. No tracheal deviation present. No thyromegaly present.   Cardiovascular: Normal rate, regular rhythm, normal heart sounds and intact distal pulses.  Exam reveals no gallop and no friction rub.    No murmur heard.  Pulmonary/Chest: Effort normal and breath sounds normal. No stridor. No respiratory distress. She has no wheezes. She has no rales. She exhibits no tenderness.   Abdominal: She exhibits no distension and no mass. There is no tenderness. There is no rebound and no guarding.   Musculoskeletal: Normal range of motion. She exhibits no edema, tenderness or deformity.   Lymphadenopathy:     She has no cervical adenopathy.   Neurological: She is alert and oriented to person, place, and time. She has normal reflexes. No cranial nerve deficit. She exhibits normal muscle tone. Gait normal. Coordination normal.   Skin: Skin is warm and dry. No rash noted. No erythema.   Psychiatric: Mood, affect and judgment normal.         ASSESSMENT Roosvelt Harps/PLAN    ICD-10-CM ICD-9-CM    1. Essential hypertension I10 401.9    2. Schatzki's ring K22.2 750.3    3. Mild intermittent asthma without complication J45.20 493.90    4. Pure hypercholesterolemia E78.00 272.0    5. History of low back pain Z87.39 V13.59    6. Palpitation R00.2 785.1 DUPLEX CAROTID BILATERAL   7. Gastroesophageal reflux disease without esophagitis K21.9 530.81      Patient with history as above high cholesterol, low back pain decreased hearing  Blood workup discussed with patient chart revised  We will start Crestor 5 mg once a day #30   Given alprazolam 0.5 mg p.o. daily as needed #30, medication was checked which monitoring substance control  I will see patient in October for annual physical

## 2015-12-21 ENCOUNTER — Encounter

## 2015-12-28 ENCOUNTER — Encounter

## 2015-12-30 ENCOUNTER — Ambulatory Visit: Payer: MEDICARE

## 2016-01-07 ENCOUNTER — Ambulatory Visit: Payer: MEDICARE

## 2016-01-07 ENCOUNTER — Inpatient Hospital Stay: Admit: 2016-01-07 | Payer: MEDICARE | Attending: Internal Medicine

## 2016-01-07 DIAGNOSIS — R42 Dizziness and giddiness: Secondary | ICD-10-CM

## 2016-02-11 MED ORDER — METOPROLOL SUCCINATE SR 25 MG 24 HR TAB
25 mg | ORAL_TABLET | ORAL | 3 refills | Status: DC
Start: 2016-02-11 — End: 2016-06-08

## 2016-02-11 MED ORDER — DEXILANT 60 MG CAPSULE, DELAYED RELEASE
60 mg | ORAL_CAPSULE | ORAL | 3 refills | Status: DC
Start: 2016-02-11 — End: 2016-06-08

## 2016-03-31 ENCOUNTER — Encounter: Attending: Internal Medicine

## 2016-04-07 ENCOUNTER — Encounter: Attending: Internal Medicine

## 2016-04-21 ENCOUNTER — Encounter: Attending: Internal Medicine

## 2016-04-28 ENCOUNTER — Ambulatory Visit: Payer: MEDICARE

## 2016-05-05 ENCOUNTER — Ambulatory Visit: Admit: 2016-05-05 | Discharge: 2016-05-05 | Payer: MEDICARE | Attending: Internal Medicine

## 2016-05-05 ENCOUNTER — Ambulatory Visit: Attending: Internal Medicine

## 2016-05-05 DIAGNOSIS — I10 Essential (primary) hypertension: Secondary | ICD-10-CM

## 2016-05-05 MED ORDER — ALPRAZOLAM 0.5 MG TAB
0.5 mg | ORAL_TABLET | Freq: Every evening | ORAL | 0 refills | Status: DC | PRN
Start: 2016-05-05 — End: 2016-08-18

## 2016-05-05 NOTE — Progress Notes (Signed)
Medicare Annual Wellness Visit Progress Note    Name: Amy StanfordSusan Cervantes    Today???s Date: 05/05/2016  Ethnicity: NON-HISPANIC  Race: WHITE OR CAUCASIAN  MRN: [id number]  Age: 65 y.o.  DOB: 04/09/1951  Sex: Female       Amy StanfordSusan Cervantes is a 65 y.o. year old female who presents today for annual wellness visit.  Patient is tolerating all medications as prescribed with no barriers to adherence noted  Vitals/BMI     Visit Vitals   ??? BP 110/60   ??? Pulse 60   ??? Resp 16   ??? Ht 5\' 2"  (1.575 m)   ??? SpO2 98%       Health Risk Assessment     How does the patient rate their overall health? Good    Diet/Lifestyle   How many servings of fruits and vegetable does the patient eat per day? 1   How many servings of fried or high fat foods does the patient eat per day? 3  Does the patient see the dentist regularly? Yes  How many days a week does the patient exercise? 3   How intense is the exercise? Moderate  Patient reported tobacco use: reports that she quit smoking about 23 years ago. She has never used smokeless tobacco.  Patient reported alcohol use:  reports that she drinks alcohol.  Alcohol Risk Factor Screening:  During the past 3 months has the patient reported consuming more than 3 drinks containing alcohol? No  Does the patient an average of more than 7 drinks per week? No    Functional Ability and Level of Safety   Patient afraid of falling  Yes  Patients home has rugs in the hallway  No  Patient has poor lighting in hallway  No  Patients home has grab bars in bathroom Yes  Patient's home has handrails on the stairs Yes  Patient wears a seat belt while in a motor vehicle: Yes    Activities of Daily Living   Self-care.   Patient has difficulties driving a car: No  Patient can shop for groceries without help: Yes  Patient prepares own meals: Yes  Patient performs housework without help: Yes  Patient can handle money without help: Yes  Does patient take medicines as prescribed: yes       Does patient worry about hazards in their home that may hurt them: No  Does the patient feel that he/she can keep adequate track of medications? Yes    Cognitive Function Screening    Orientation: oriented to time, place, person and situation  Mood/Affect: happy  Appearance: age appropriate and well dressed  Attention/Concentration: Normal  Memory: adequate  Intelligence: Above average  Family Member/Caregiver Input: none    Depression Risk Factor Screening     PHQ over the last two weeks 05/05/2016   Little interest or pleasure in doing things Not at all   Feeling down, depressed or hopeless Not at all   Total Score PHQ 2 0   Trouble falling or staying asleep, or sleeping too much More than half the days   Feeling tired or having little energy Not at all   Poor appetite or overeating Not at all   Feeling bad about yourself - or that you are a failure or have let yourself or your family down Not at all   Trouble concentrating on things such as school, work, reading or watching TV Not at all   Moving or speaking so slowly that other people could  have noticed; or the opposite being so fidgety that others notice Not at all   Thoughts of being better off dead, or hurting yourself in some way Not at all   PHQ 9 Score 2   How difficult have these problems made it for you to do your work, take care of your home and get along with others Not difficult at all     Depression screening reviewed.    Fall Risk Screening     Fall Risk Assessment, last 12 mths 07/02/2015   Able to walk? Yes   Fall in past 12 months? No   Number of falls in past 12 months -     Fall screening reviewed.    Hearing and Vision Screening    Visual Acuity Screening    Right eye Left eye Both eyes   Without correction: 20/70 20/50 20/50    With correction: 20/40 20/25 20/20    Hearing Screening Comments: Passed whisper test    Abuse Screen   Patient reports: Patient is not abused    Patient Care Team   Patient Care Team:   Leia Alf, MD as PCP - General  Leia Alf, MD    History     Past Medical History:   Diagnosis Date   ??? Anxiety    ??? Asthma    ??? Gastric ulcer     H. pylori negative   ??? GERD (gastroesophageal reflux disease)    ??? Hiatal hernia    ??? Hyperlipidemia    ??? Hypertension    ??? Insomnia    ??? Low back pain    ??? Near syncope    ??? Osteoporosis    ??? Palpitation    ??? Schatzki's ring     status post ballooning       Past Surgical History:   Procedure Laterality Date   ??? HX COLONOSCOPY     ??? HX ENDOSCOPY     ??? HX GYN      tubal ligation   ??? HX HEENT      deviated septum surgery   ??? HX ORTHOPAEDIC      toe surgery       Allergies   Allergen Reactions   ??? Iodinated Contrast- Oral And Iv Dye Unknown (comments)   ??? Levaquin [Levofloxacin] Palpitations   ??? Sulfur Other (comments)       Family History   Problem Relation Age of Onset   ??? Hypertension Father    ??? Heart Attack Father    ??? Cancer Brother    ??? Breast Cancer Paternal Grandmother    ??? No Known Problems Brother        Social History   Substance Use Topics   ??? Smoking status: Former Smoker     Quit date: 03/11/1993   ??? Smokeless tobacco: Never Used   ??? Alcohol use Yes      Comment: occ       Patient Active Problem List    Diagnosis Date Noted   ??? Gastroesophageal reflux disease without esophagitis 12/17/2015   ??? Acute bronchitis 05/28/2015   ??? History of low back pain 05/28/2015   ??? Hyperlipidemia    ??? Hypertension    ??? Palpitation    ??? Near syncope    ??? Asthma    ??? Schatzki's ring        Current Medications     Current Outpatient Prescriptions   Medication Sig Dispense Refill   ??? lisinopril (PRINIVIL, ZESTRIL) 5 mg  tablet Take  by mouth daily.     ??? ALPRAZolam (XANAX) 0.5 mg tablet Take 1 Tab by mouth nightly as needed for Anxiety. Max Daily Amount: 0.5 mg. 30 Tab 0   ??? DEXILANT 60 mg CpDB TAKE 1 CAPSULE EVERY DAY 30 Cap 3   ??? metoprolol succinate (TOPROL-XL) 25 mg XL tablet TAKE 1 TABLET EVERY DAY 30 Tab 3    ??? rosuvastatin (CRESTOR) 5 mg tablet Take 1 Tab by mouth nightly. 30 Tab 3   ??? promethazine-codeine (PHENERGAN WITH CODEINE) 6.25-10 mg/5 mL syrup Take 5 mL by mouth four (4) times daily as needed for Cough. Max Daily Amount: 20 mL. 120 mL 0   ??? azelastine (ASTELIN) 137 mcg (0.1 %) nasal spray INHALE TWO SPRAYS IN EACH NOSTRIL TWICE DAILY 1 Bottle 1   ??? lisinopril (PRINIVIL, ZESTRIL) 10 mg tablet Take 10 mg by mouth two (2) times a day.     ??? amoxicillin-clavulanate (AUGMENTIN) 875-125 mg per tablet Take 1 Tab by mouth every twelve (12) hours. 14 Tab 0   ??? methylPREDNISolone (MEDROL DOSEPACK) 4 mg tablet As directed on pack 1 Dose Pack 0         Prevention and Maintenance   Last Lipids:  No results found for: CHOL, CHOLPOCT, CHOLX, CHLST, CHOLV, HDL, HDLPOC, LDL, LDLCPOC, LDLC, DLDLP, VLDLC, VLDL, TGLX, TRIGL, TRIGP, TGLPOCT, CHHD, CHHDX    Last Glucose:  No results found for: GLU, GLUCPOC  No results found for: HBA1C, HGBE8, HBA1CPOC, HBA1CEXT, HBA1CEXT  Last Mammogram:    Results from Hospital Encounter encounter on 12/17/15   MAM MAMMO BI SCREENING INCL CAD   Narrative BILATERAL DIGITAL SCREEN MAMMOGRAMS WITH CAD AND COMPLETE BILATERAL BREAST   ULTRASOUND DATED 12/17/2015      CLINICAL HISTORY:     65 years old female here for annual screening mammography and screening   bilateral breast ultrasound examinations.    Family history of breast cancer: Negative.    According to patient, last clinical breast examination performed: January 2016.      COMPARISON:   Mammograms dating back to 2012, and breast ultrasound examinations  from 2015.      TECHNIQUE: CC and MLO views of both breasts were obtained.  Computer aided   detection was utilized.      FINDINGS:   Breast tissue composition/density: Scattered.    One or 2 benign-appearing calcifications in each breast. Benign-appearing   asymmetry in the central posterior right breast on the MLO view, unchanged since   2011, unchanged. No suspicious microcalcifications, masses, or architectural   distortion identified bilaterally.      Radial and anti-radial 2D-grey scale and power doppler sonographic images of 4   quadrants, including the retroareolar region of both breasts, and both axilla,   are obtained. Breast tissue composition: Homogeneous, fibroglandular. No   suspicious cystic or solid lesions are identified bilaterally.             Impression IMPRESSION:   Benign mammographic and sonographic findings of both breasts, unchanged.      BI-RADS 2, benign.      RECOMMENDATION: Routine annual screening mammography.    Patient will receive a written summary in lay terms.    Patient was entered into a reminder system with a target date for her next   mammogram.    THIS DOCUMENT HAS BEEN ELECTRONICALLY SIGNED BY ADAM MIZRACHI MD        Recent Vaccinations/Immunization History    There is no immunization  history on file for this patient.    Health Maintenance   Topic Date Due   ??? Hepatitis C Screening  12/20/1950   ??? DTaP/Tdap/Td series (1 - Tdap) 04/25/1971   ??? FOBT Q 1 YEAR AGE 28-75  04/24/2000   ??? ZOSTER VACCINE AGE 71>  02/22/2010   ??? Pneumococcal 65+ Low/Medium Risk (1 of 2 - PCV13) 04/25/2015   ??? MEDICARE YEARLY EXAM  04/25/2015   ??? GLAUCOMA SCREENING Q2Y  06/12/2016 (Originally 04/25/2015)   ??? BREAST CANCER SCRN MAMMOGRAM  12/16/2017   ??? OSTEOPOROSIS SCREENING (DEXA)  Completed   ??? Influenza Age 41 to Adult  Addressed       End of Life Planning/Advanced Directives   Discussed completing Advance Directive, Documented assigning a Health Care Proxy, Noted Health Care Proxy and/or MOLST and/or Advance Directive already exist, Provided/reviewed Health Care Proxy and discussed importance of updating/completing, Code status reviewed, Reviewed end of life issues and Patient noted to have MOLST form and/or Health Care Proxy  No flowsheet data found.      Advice/Referrals/Counseling    Are appropriate based on today's review and evaluation  End-of-Life planning (with patient's consent)  Screening Pap and pelvic (covered once every 2 years)  Colorectal cancer screening tests  Cardiovascular screening blood test  Bone mass measurement (DEXA)  Diabetes screening test  Medications discussed, including all new medications prescribed at this visit.  Indications and side effects reviewed with the patient/family/caregiver with understanding verbalized  Assessment/Plan       ICD-10-CM ICD-9-CM    1. Essential hypertension I10 401.9 AMB POC EKG ROUTINE W/ 12 LEADS, SCREEN (G0403)      CBC WITH AUTOMATED DIFF      METABOLIC PANEL, COMPREHENSIVE   2. Gastroesophageal reflux disease without esophagitis K21.9 530.81    3. Schatzki's ring K22.2 750.3    4. Mild intermittent asthma without complication J45.20 493.90    5. History of low back pain Z87.39 V13.59    6. Palpitation R00.2 785.1    7. Pure hypercholesterolemia E78.00 272.0 LIPID PANEL      VITAMIN D, 25 HYDROXY     Encounter Diagnoses   Name Primary?   ??? Essential hypertension Yes   ??? Gastroesophageal reflux disease without esophagitis    ??? Schatzki's ring    ??? Mild intermittent asthma without complication    ??? History of low back pain    ??? Palpitation    ??? Pure hypercholesterolemia      Orders Placed This Encounter   ??? CBC/Diff/Platelets   ??? Comp Metabolic Panel   ??? Lipid Panel   ??? VITAMIN D, 25 HYDROXY   ??? AMB POC EKG ROUTINE W/ 12 LEADS, SCREEN (Y7829)   ??? lisinopril (PRINIVIL, ZESTRIL) 5 mg tablet   ??? ALPRAZolam (XANAX) 0.5 mg tablet     Orders Placed This Encounter   ??? CBC/Diff/Platelets   ??? Comp Metabolic Panel   ??? Lipid Panel   ??? VITAMIN D, 25 HYDROXY   ??? AMB POC EKG ROUTINE W/ 12 LEADS, SCREEN ([id number])     Order Specific Question:   Reason for Exam:     Answer:   for physical exam   ??? ALPRAZolam (XANAX) 0.5 mg tablet     Sig: Take 1 Tab by mouth nightly as needed for Anxiety. Max Daily Amount: 0.5 mg.     Dispense:  30 Tab     Refill:  0      Orders Placed This Encounter   ???  CBC/Diff/Platelets   ??? Comp Metabolic Panel   ??? Lipid Panel   ??? VITAMIN D, 25 HYDROXY   ??? AMB POC EKG ROUTINE W/ 12 LEADS, SCREEN ([id number]([id number])   ??? ALPRAZolam (XANAX) 0.5 mg tablet     Diagnoses and all orders for this visit:    1. Essential hypertension  -     AMB POC EKG ROUTINE W/ 12 LEADS, SCREEN ([id number]([id number])  -     CBC/Diff/Platelets  -     Comp Metabolic Panel    2. Gastroesophageal reflux disease without esophagitis    3. Schatzki's ring    4. Mild intermittent asthma without complication    5. History of low back pain    6. Palpitation    7. Pure hypercholesterolemia  -     Lipid Panel  -     VITAMIN D, 25 HYDROXY    Other orders  -     ALPRAZolam (XANAX) 0.5 mg tablet; Take 1 Tab by mouth nightly as needed for Anxiety. Max Daily Amount: 0.5 mg.      Follow-up Disposition: Not on File  current treatment plan is effective, no change in therapy  lab results and schedule of future lab studies reviewed with patient  reviewed diet, exercise and weight control  cardiovascular risk and specific lipid/LDL goals reviewed      Leia AlfBoris Jayquon Theiler, MD  05/05/2016   10:57 AM

## 2016-05-12 ENCOUNTER — Inpatient Hospital Stay: Admit: 2016-05-12 | Payer: MEDICARE | Attending: Internal Medicine

## 2016-05-12 DIAGNOSIS — Z13820 Encounter for screening for osteoporosis: Secondary | ICD-10-CM

## 2016-05-22 ENCOUNTER — Encounter

## 2016-06-08 MED ORDER — DEXILANT 60 MG CAPSULE, DELAYED RELEASE
60 mg | ORAL_CAPSULE | ORAL | 3 refills | Status: DC
Start: 2016-06-08 — End: 2016-09-28

## 2016-06-08 MED ORDER — METOPROLOL SUCCINATE SR 25 MG 24 HR TAB
25 mg | ORAL_TABLET | ORAL | 3 refills | Status: DC
Start: 2016-06-08 — End: 2016-09-28

## 2016-07-08 LAB — CBC WITH AUTOMATED DIFF
ABS. BASOPHILS: 29 Cells/mcL (ref 0–200)
ABS. EOSINOPHILS: 103 Cells/mcL (ref 15–500)
ABS. LYMPHOCYTES: 1490 Cells/mcL (ref 850–3900)
ABS. MONOCYTES: 377 Cells/mcL (ref 200–950)
ABS. NEUTROPHILS: 2901 Cells/mcL (ref 1500–7800)
BASOPHILS: 0.6 % (ref 0–2)
EOSINOPHILS: 2.1 % (ref 0–8)
HCT: 41 % (ref 35.0–45.0)
HGB: 13.4 g/dL (ref 11.7–15.5)
LYMPHOCYTES: 30.4 % (ref 15–49)
MCH: 30 pg (ref 27.0–33.0)
MCHC: 32.8 g/dL (ref 32.0–36.0)
MCV: 91.3 fL (ref 80.0–100.0)
MEAN PLATELET VOLUME: 9.7 fL (ref 7.5–12.5)
MONOCYTES: 7.7 % (ref 0–13)
Neutrophils: 59.2 % (ref 38–80)
PLATELET: 238 10*3/uL (ref 140–400)
RBC: 4.49 10*6/uL (ref 3.80–5.10)
RDW: 13.4 % (ref 11.0–15.0)
WBC: 4.9 10*3/uL (ref 3.8–10.8)

## 2016-07-08 LAB — METABOLIC PANEL, COMPREHENSIVE
ALB/GLOBRATIO: 1.7 (calc) (ref 1.0–2.5)
ALT (SGPT): 12 U/L (ref 6–29)
AST (SGOT): 16 U/L (ref 10–35)
Albumin: 4.3 g/dL (ref 3.6–5.1)
Alk. phosphatase: 60 U/L (ref 33–130)
BUN: 22 mg/dL (ref 7–25)
Bilirubin, total: 0.6 mg/dL (ref 0.2–1.2)
CO2: 30 mmol/L (ref 20–31)
Calcium: 9.8 mg/dL (ref 8.6–10.4)
Chloride: 105 mmol/L (ref 98–110)
Creatinine: 0.76 mg/dL (ref 0.50–0.99)
GFR est AA: 95 mL/min/{1.73_m2} (ref >=60)
GFR est non-AA: 82 mL/min/{1.73_m2} (ref >=60)
Globulin: 2.6 g/dL (calc) (ref 1.9–3.7)
Glucose: 92 mg/dL (ref 65–99)
Potassium: 4.3 mmol/L (ref 3.5–5.3)
Protein, total: 6.9 g/dL (ref 6.1–8.1)
Sodium: 141 mmol/L (ref 135–146)

## 2016-07-08 LAB — LIPID PANEL
Cholesterol, total: 233 mg/dL — ABNORMAL HIGH (ref <200)
Cholesterol/HDL ratio: 3.1 calc (ref <5.0)
HDL Cholesterol: 76 mg/dL (ref >50)
LDL CHOL, CALCULATED: 135 mg/dL — ABNORMAL HIGH (ref <100)
Non-HDL Cholesterol: 157 mg/dL (calc) — ABNORMAL HIGH (ref <130)
Triglyceride: 111 mg/dL (ref <150)

## 2016-07-08 LAB — VITAMIN D, 25 HYDROXY: VITAMIN D, 25-HYDROXY: 36 ng/mL (ref 30–100)

## 2016-07-28 NOTE — Telephone Encounter (Signed)
LMOM looking for preventive health screening information for the ACO Audit.     Should the patient call back, looking for the following information:     1. Has the patient had a colonoscopy or any other colorectal cancer screening, If yes, When, Where and by Whom (we need to get report and scanned in chart)     2. Has the patient ever had the pneumonia vaccine, if yes, approx date and if they know which one (23 or 13)     I left my line for call back [phone]

## 2016-08-18 ENCOUNTER — Ambulatory Visit: Admit: 2016-08-18 | Discharge: 2016-08-18 | Payer: MEDICARE | Attending: Internal Medicine

## 2016-08-18 DIAGNOSIS — I10 Essential (primary) hypertension: Secondary | ICD-10-CM

## 2016-08-18 MED ORDER — ALPRAZOLAM 0.5 MG TAB
0.5 mg | ORAL_TABLET | Freq: Every evening | ORAL | 0 refills | Status: DC | PRN
Start: 2016-08-18 — End: 2016-12-29

## 2016-08-18 MED ORDER — ATORVASTATIN 10 MG TAB
10 mg | ORAL_TABLET | Freq: Every day | ORAL | 3 refills | Status: DC
Start: 2016-08-18 — End: 2016-12-08

## 2016-08-18 NOTE — Progress Notes (Signed)
HISTORY OF PRESENT ILLNESS  Amy Cervantes is a 66 y.o. female patient known to me very well for which multiple medical problems resented to for follow-up and pain in both elbows palpitations no complaint of chest pain shortness of breath abdominal pain or change in mental status elbows painful and which some numbness in the fingers        Allergies   Allergen Reactions   ??? Iodinated Contrast- Oral And Iv Dye Unknown (comments)   ??? Levaquin [Levofloxacin] Palpitations   ??? Sulfur Other (comments)     Social History     Social History   ??? Marital status: MARRIED     Spouse name: N/A   ??? Number of children: N/A   ??? Years of education: N/A     Occupational History   ??? Not on file.     Social History Main Topics   ??? Smoking status: Former Smoker     Quit date: 03/11/1993   ??? Smokeless tobacco: Never Used   ??? Alcohol use Yes      Comment: occ   ??? Drug use: No   ??? Sexual activity: Not on file     Other Topics Concern   ??? Not on file     Social History Narrative     Past Medical History:   Diagnosis Date   ??? Anxiety    ??? Asthma    ??? Gastric ulcer     H. pylori negative   ??? GERD (gastroesophageal reflux disease)    ??? Hiatal hernia    ??? Hyperlipidemia    ??? Hypertension    ??? Insomnia    ??? Low back pain    ??? Near syncope    ??? Osteoporosis    ??? Palpitation    ??? Schatzki's ring     status post ballooning     Past Surgical History:   Procedure Laterality Date   ??? HX COLONOSCOPY     ??? HX ENDOSCOPY     ??? HX GYN      tubal ligation   ??? HX HEENT      deviated septum surgery   ??? HX ORTHOPAEDIC      toe surgery     Current Outpatient Prescriptions   Medication Sig Dispense Refill   ??? ALPRAZolam (XANAX) 0.5 mg tablet Take 1 Tab by mouth nightly as needed for Anxiety. Max Daily Amount: 0.5 mg. 30 Tab 0   ??? atorvastatin (LIPITOR) 10 mg tablet Take 1 Tab by mouth daily. 30 Tab 3   ??? DEXILANT 60 mg CpDB TAKE 1 CAPSULE EVERY DAY 30 Cap 3   ??? metoprolol succinate (TOPROL-XL) 25 mg XL tablet TAKE 1 TABLET EVERY DAY 30 Tab 3    ??? rosuvastatin (CRESTOR) 5 mg tablet Take 1 Tab by mouth nightly. 30 Tab 3   ??? lisinopril (PRINIVIL, ZESTRIL) 10 mg tablet Take 10 mg by mouth daily.     ??? lisinopril (PRINIVIL, ZESTRIL) 5 mg tablet Take  by mouth daily.     ??? amoxicillin-clavulanate (AUGMENTIN) 875-125 mg per tablet Take 1 Tab by mouth every twelve (12) hours. 14 Tab 0   ??? methylPREDNISolone (MEDROL DOSEPACK) 4 mg tablet As directed on pack 1 Dose Pack 0   ??? promethazine-codeine (PHENERGAN WITH CODEINE) 6.25-10 mg/5 mL syrup Take 5 mL by mouth four (4) times daily as needed for Cough. Max Daily Amount: 20 mL. 120 mL 0   ??? azelastine (ASTELIN) 137 mcg (0.1 %) nasal spray  INHALE TWO SPRAYS IN EACH NOSTRIL TWICE DAILY 1 Bottle 1       ROS  Review of Systems   Constitutional: Negative for chills, diaphoresis, fever, malaise/fatigue and weight loss.   HENT: Negative for ear discharge, ear pain and nosebleeds.    Eyes: Negative for blurred vision, double vision, photophobia, discharge and redness.   Respiratory: Negative for cough, hemoptysis, sputum production and shortness of breath.    Cardiovascular: Negative for chest pain, palpitations, orthopnea, claudication and leg swelling.   Gastrointestinal: Negative for abdominal pain, blood in stool, constipation, diarrhea, heartburn, melena, nausea and vomiting.   Genitourinary: Negative for dysuria, frequency, hematuria and urgency.   Musculoskeletal: Negative for back pain, joint pain, myalgias and neck pain.   Skin: Negative for itching and rash.   Neurological: Negative for dizziness, tingling, tremors, sensory change, speech change, seizures, loss of consciousness, weakness and headaches.   Psychiatric/Behavioral: Negative for depression, hallucinations, memory loss, substance abuse and suicidal ideas.       Physical Exam  Visit Vitals   ??? BP 106/76   ??? Pulse 66   ??? Temp 98.2 ??F (36.8 ??C) (Tympanic)   ??? Resp 18   ??? Ht 5\' 2"  (1.575 m)   ??? SpO2 99%       Physical Exam    Constitutional: She is oriented to person, place, and time and well-developed, well-nourished, and in no distress. No distress.   HENT:   Head: Normocephalic and atraumatic.   Right Ear: External ear normal.   Left Ear: External ear normal.   Nose: Nose normal.   Mouth/Throat: Oropharynx is clear and moist.   Eyes: Conjunctivae and EOM are normal. Pupils are equal, round, and reactive to light. Right eye exhibits no discharge. Left eye exhibits no discharge.   Neck: Normal range of motion. Neck supple. No JVD present. No tracheal deviation present. No thyromegaly present.   Cardiovascular: Normal rate, regular rhythm, normal heart sounds and intact distal pulses.  Exam reveals no gallop and no friction rub.    No murmur heard.  Pulmonary/Chest: Effort normal and breath sounds normal. No stridor. No respiratory distress. She has no wheezes. She has no rales. She exhibits no tenderness.   Abdominal: She exhibits no distension and no mass. There is no tenderness. There is no rebound and no guarding.   Musculoskeletal: Normal range of motion. She exhibits no edema, tenderness or deformity.   Lymphadenopathy:     She has no cervical adenopathy.   Neurological: She is alert and oriented to person, place, and time. She has normal reflexes. No cranial nerve deficit. She exhibits normal muscle tone. Gait normal. Coordination normal.   Skin: Skin is warm and dry. No rash noted. No erythema.   Psychiatric: Mood, affect and judgment normal.         ASSESSMENT Roosvelt Harps    ICD-10-CM ICD-9-CM    1. Essential hypertension I10 401.9 atorvastatin (LIPITOR) 10 mg tablet      CBC WITH AUTOMATED DIFF      METABOLIC PANEL, COMPREHENSIVE      LIPID PANEL      VITAMIN D, 25 HYDROXY   2. Gastroesophageal reflux disease without esophagitis K21.9 530.81    3. Schatzki's ring K22.2 750.3    4. Mild intermittent asthma without complication J45.20 493.90    5. History of low back pain Z87.39 V13.59    6. Palpitation R00.2 785.1 TSH AND FREE T4    7. Pure hypercholesterolemia E78.00 272.0  8. Anxiety F41.9 300.00 ALPRAZolam (XANAX) 0.5 mg tablet     Patient was seen and evaluated, all medical problems revised, blood workup discussed with patient all question was answered patient was seen and surgeon Dr. Neita Carpsoda

## 2016-10-02 MED ORDER — METOPROLOL SUCCINATE SR 25 MG 24 HR TAB
25 mg | ORAL_TABLET | ORAL | 3 refills | Status: DC
Start: 2016-10-02 — End: 2016-12-29

## 2016-10-02 MED ORDER — DEXILANT 60 MG CAPSULE, DELAYED RELEASE
60 mg | ORAL_CAPSULE | ORAL | 3 refills | Status: DC
Start: 2016-10-02 — End: 2017-01-24

## 2016-12-08 ENCOUNTER — Encounter

## 2016-12-08 MED ORDER — ATORVASTATIN 10 MG TAB
10 mg | ORAL_TABLET | ORAL | 3 refills | Status: DC
Start: 2016-12-08 — End: 2017-04-06

## 2016-12-22 LAB — LIPID PANEL
Cholesterol, total: 196 mg/dL (ref <200)
Cholesterol/HDL ratio: 2.5 calc (ref <5.0)
HDL Cholesterol: 78 mg/dL (ref >50)
LDL CHOL, CALCULATED: 99 mg/dL (ref <100)
Non-HDL Cholesterol: 118 mg/dL (calc) (ref <130)
Triglyceride: 97 mg/dL (ref <150)

## 2016-12-22 LAB — METABOLIC PANEL, COMPREHENSIVE
ALB/GLOBRATIO: 1.6 (calc) (ref 1.0–2.5)
ALT (SGPT): 16 U/L (ref 6–29)
AST (SGOT): 19 U/L (ref 10–35)
Albumin: 4.2 g/dL (ref 3.6–5.1)
Alk. phosphatase: 66 U/L (ref 33–130)
BUN: 22 mg/dL (ref 7–25)
Bilirubin, total: 0.5 mg/dL (ref 0.2–1.2)
CO2: 28 mmol/L (ref 20–31)
Calcium: 9.6 mg/dL (ref 8.6–10.4)
Chloride: 104 mmol/L (ref 98–110)
Creatinine: 0.71 mg/dL (ref 0.50–0.99)
GFR est AA: 103 mL/min/{1.73_m2} (ref >=60)
GFR est non-AA: 89 mL/min/{1.73_m2} (ref >=60)
Globulin: 2.6 g/dL (calc) (ref 1.9–3.7)
Glucose: 101 mg/dL — ABNORMAL HIGH (ref 65–99)
Potassium: 4.5 mmol/L (ref 3.5–5.3)
Protein, total: 6.8 g/dL (ref 6.1–8.1)
Sodium: 140 mmol/L (ref 135–146)

## 2016-12-22 LAB — TSH AND FREE T4
T4, Free: 1.3 ng/dL (ref 0.8–1.8)
TSH: 1.06 mIU/L (ref 0.40–4.50)

## 2016-12-22 LAB — CBC WITH AUTOMATED DIFF
ABS. BASOPHILS: 24 Cells/mcL (ref 0–200)
ABS. EOSINOPHILS: 177 Cells/mcL (ref 15–500)
ABS. LYMPHOCYTES: 1310 Cells/mcL (ref 850–3900)
ABS. MONOCYTES: 283 Cells/mcL (ref 200–950)
ABS. NEUTROPHILS: 4106 Cells/mcL (ref 1500–7800)
BASOPHILS: 0.4 % (ref 0–2)
EOSINOPHILS: 3 % (ref 0–8)
HCT: 37.4 % (ref 35.0–45.0)
HGB: 12.4 g/dL (ref 11.7–15.5)
LYMPHOCYTES: 22.2 % (ref 15–49)
MCH: 30.1 pg (ref 27.0–33.0)
MCHC: 33.2 g/dL (ref 32.0–36.0)
MCV: 90.5 fL (ref 80.0–100.0)
MEAN PLATELET VOLUME: 9.6 fL (ref 7.5–12.5)
MONOCYTES: 4.8 % (ref 0–13)
Neutrophils: 69.6 % (ref 38–80)
PLATELET: 237 10*3/uL (ref 140–400)
RBC: 4.14 10*6/uL (ref 3.80–5.10)
RDW: 14.1 % (ref 11.0–15.0)
WBC: 5.9 10*3/uL (ref 3.8–10.8)

## 2016-12-22 LAB — VITAMIN D, 25 HYDROXY: VITAMIN D, 25-HYDROXY: 33 ng/mL (ref 30–100)

## 2016-12-29 ENCOUNTER — Ambulatory Visit: Admit: 2016-12-29 | Discharge: 2016-12-29 | Payer: MEDICARE | Attending: Internal Medicine

## 2016-12-29 ENCOUNTER — Encounter

## 2016-12-29 DIAGNOSIS — K222 Esophageal obstruction: Secondary | ICD-10-CM

## 2016-12-29 MED ORDER — FLUTICASONE 50 MCG/ACTUATION NASAL SPRAY, SUSP
50 mcg/actuation | Freq: Every day | NASAL | 0 refills | Status: DC
Start: 2016-12-29 — End: 2017-01-24

## 2016-12-29 MED ORDER — FLUTICASONE 50 MCG/ACTUATION NASAL SPRAY, SUSP
50 mcg/actuation | Freq: Every day | NASAL | 0 refills | Status: AC
Start: 2016-12-29 — End: ?

## 2016-12-29 MED ORDER — ALPRAZOLAM 0.5 MG TAB
0.5 mg | ORAL_TABLET | Freq: Every evening | ORAL | 0 refills | Status: DC | PRN
Start: 2016-12-29 — End: 2017-03-16

## 2016-12-29 MED ORDER — METOPROLOL SUCCINATE SR 25 MG 24 HR TAB
25 mg | ORAL_TABLET | ORAL | 3 refills | Status: DC
Start: 2016-12-29 — End: 2017-06-01

## 2016-12-29 MED ORDER — AMOXICILLIN CLAVULANATE 875 MG-125 MG TAB
875-125 mg | ORAL_TABLET | Freq: Two times a day (BID) | ORAL | 0 refills | Status: AC
Start: 2016-12-29 — End: ?

## 2016-12-29 NOTE — Progress Notes (Addendum)
HISTORY OF PRESENT ILLNESS  Amy Cervantes is a 66 y.o. female patient known to me very well since previous visit she presented today which history having sinus discomfort and checkup regular  Otherwise she is doing well no complaint on temperature chills, chest pain shortness of breath abdominal pain no changes in mental status no evidence no neurological deficiency        Allergies   Allergen Reactions   ??? Iodinated Contrast- Oral And Iv Dye Unknown (comments)   ??? Levaquin [Levofloxacin] Palpitations   ??? Sulfur Other (comments)     Social History     Social History   ??? Marital status: MARRIED     Spouse name: N/A   ??? Number of children: N/A   ??? Years of education: N/A     Occupational History   ??? Not on file.     Social History Main Topics   ??? Smoking status: Former Smoker     Quit date: 03/11/1993   ??? Smokeless tobacco: Never Used   ??? Alcohol use Yes      Comment: occ   ??? Drug use: No   ??? Sexual activity: Not on file     Other Topics Concern   ??? Not on file     Social History Narrative     Past Medical History:   Diagnosis Date   ??? Anxiety    ??? Asthma    ??? Gastric ulcer     H. pylori negative   ??? GERD (gastroesophageal reflux disease)    ??? Hiatal hernia    ??? Hyperlipidemia    ??? Hypertension    ??? Insomnia    ??? Low back pain    ??? Near syncope    ??? Osteoporosis    ??? Palpitation    ??? Schatzki's ring     status post ballooning     Past Surgical History:   Procedure Laterality Date   ??? HX COLONOSCOPY     ??? HX ENDOSCOPY     ??? HX GYN      tubal ligation   ??? HX HEENT      deviated septum surgery   ??? HX ORTHOPAEDIC      toe surgery     Current Outpatient Prescriptions   Medication Sig Dispense Refill   ??? ALPRAZolam (XANAX) 0.5 mg tablet Take 1 Tab by mouth nightly as needed for Anxiety. Max Daily Amount: 0.5 mg. 30 Tab 0   ??? metoprolol succinate (TOPROL-XL) 25 mg XL tablet TAKE 1 TABLET EVERY DAY 30 Tab 3   ??? amoxicillin-clavulanate (AUGMENTIN) 875-125 mg per tablet Take 1 Tab by mouth every twelve (12) hours. 20 Tab 0    ??? fluticasone (FLONASE) 50 mcg/actuation nasal spray 2 Sprays by Both Nostrils route daily. 1 Bottle 0   ??? atorvastatin (LIPITOR) 10 mg tablet TAKE 1 TABLET BY MOUTH DAILY 30 Tab 3   ??? DEXILANT 60 mg CpDB TAKE 1 CAPSULE BY MOUTH EVERY DAY 30 Cap 3   ??? lisinopril (PRINIVIL, ZESTRIL) 5 mg tablet Take  by mouth daily.     ??? azelastine (ASTELIN) 137 mcg (0.1 %) nasal spray INHALE TWO SPRAYS IN EACH NOSTRIL TWICE DAILY 1 Bottle 1   ??? lisinopril (PRINIVIL, ZESTRIL) 10 mg tablet Take 10 mg by mouth daily.     ??? amoxicillin-clavulanate (AUGMENTIN) 875-125 mg per tablet Take 1 Tab by mouth every twelve (12) hours. 14 Tab 0   ??? methylPREDNISolone (MEDROL DOSEPACK) 4 mg tablet As  directed on pack 1 Dose Pack 0   ??? promethazine-codeine (PHENERGAN WITH CODEINE) 6.25-10 mg/5 mL syrup Take 5 mL by mouth four (4) times daily as needed for Cough. Max Daily Amount: 20 mL. 120 mL 0       ROS  Review of Systems   Constitutional: Negative for chills, diaphoresis, fever, malaise/fatigue and weight loss.   HENT: Negative for ear discharge, ear pain and nosebleeds.    Eyes: Negative for blurred vision, double vision, photophobia, discharge and redness.   Respiratory: Negative for cough, hemoptysis, sputum production and shortness of breath.    Cardiovascular: Negative for chest pain, palpitations, orthopnea, claudication and leg swelling.   Gastrointestinal: Negative for abdominal pain, blood in stool, constipation, diarrhea, heartburn, melena, nausea and vomiting.   Genitourinary: Negative for dysuria, frequency, hematuria and urgency.   Musculoskeletal: Negative for back pain, joint pain, myalgias and neck pain.   Skin: Negative for itching and rash.   Neurological: Negative for dizziness, tingling, tremors, sensory change, speech change, seizures, loss of consciousness, weakness and headaches.   Psychiatric/Behavioral: Negative for depression, hallucinations, memory loss, substance abuse and suicidal ideas.       Physical Exam   Visit Vitals   ??? BP 114/86   ??? Pulse 68   ??? Temp 98 ??F (36.7 ??C) (Tympanic)   ??? Ht 5\' 2"  (1.575 m)   ??? SpO2 97%       Physical Exam   Constitutional: She is oriented to person, place, and time and well-developed, well-nourished, and in no distress. No distress.   HENT:   Head: Normocephalic and atraumatic.   Right Ear: External ear normal.   Left Ear: External ear normal.   Nose: Nose normal.   Mouth/Throat: Oropharynx is clear and moist.   Eyes: Conjunctivae and EOM are normal. Pupils are equal, round, and reactive to light. Right eye exhibits no discharge. Left eye exhibits no discharge.   Neck: Normal range of motion. Neck supple. No JVD present. No tracheal deviation present. No thyromegaly present.   Cardiovascular: Normal rate, regular rhythm, normal heart sounds and intact distal pulses.  Exam reveals no gallop and no friction rub.    No murmur heard.  Pulmonary/Chest: Effort normal and breath sounds normal. No stridor. No respiratory distress. She has no wheezes. She has no rales. She exhibits no tenderness.   Abdominal: She exhibits no distension and no mass. There is no tenderness. There is no rebound and no guarding.   Musculoskeletal: Normal range of motion. She exhibits no edema, tenderness or deformity.   Lymphadenopathy:     She has no cervical adenopathy.   Neurological: She is alert and oriented to person, place, and time. She has normal reflexes. No cranial nerve deficit. She exhibits normal muscle tone. Gait normal. Coordination normal.   Skin: Skin is warm and dry. No rash noted. No erythema.   Psychiatric: Mood, affect and judgment normal.         ASSESSMENT /PLAN    ICD-10-CM ICD-9-CM    1. Schatzki's ring K22.2 750.3    2. Anxiety F41.9 300.00 ALPRAZolam (XANAX) 0.5 mg tablet      MAM MAMMO BI SCREENING INCL CAD      CBC WITH AUTOMATED DIFF      METABOLIC PANEL, COMPREHENSIVE      LIPID PANEL      HEMOGLOBIN A1C W/O EAG   3. Palpitation R00.2 785.1    4. Essential hypertension I10 401.9     5. Pure hypercholesterolemia  E78.00 272.0    6. History of low back pain Z87.39 V13.59    7. Gastroesophageal reflux disease without esophagitis K21.9 530.81    8. Mild intermittent asthma without complication J45.20 493.90    9. Acute maxillary sinusitis, recurrence not specified J01.00 461.0      Patient with history as above present time medically stable  All problems revised  Given Augmentin 875 mg p.o. twice daily for 10 days and Flonase nasal spray 2 sprays to each nostril once a day  Given Xanax 0.5 mg p.o. daily as needed #30 as needed medication was checked with [place] [facility] prescription monitoring program

## 2017-01-26 MED ORDER — DEXILANT 60 MG CAPSULE, DELAYED RELEASE
60 mg | ORAL_CAPSULE | ORAL | 3 refills | Status: DC
Start: 2017-01-26 — End: 2017-06-07

## 2017-01-26 MED ORDER — FLUTICASONE 50 MCG/ACTUATION NASAL SPRAY, SUSP
50 mcg/actuation | NASAL | 0 refills | Status: DC
Start: 2017-01-26 — End: 2017-02-26

## 2017-02-06 ENCOUNTER — Encounter

## 2017-02-08 ENCOUNTER — Encounter

## 2017-02-09 ENCOUNTER — Inpatient Hospital Stay: Payer: MEDICARE | Attending: Internal Medicine

## 2017-02-21 ENCOUNTER — Encounter

## 2017-02-23 ENCOUNTER — Inpatient Hospital Stay: Admit: 2017-02-23 | Payer: MEDICARE | Attending: Internal Medicine

## 2017-02-23 DIAGNOSIS — R922 Inconclusive mammogram: Secondary | ICD-10-CM

## 2017-02-23 DIAGNOSIS — Z1231 Encounter for screening mammogram for malignant neoplasm of breast: Secondary | ICD-10-CM

## 2017-02-26 MED ORDER — FLUTICASONE 50 MCG/ACTUATION NASAL SPRAY, SUSP
50 mcg/actuation | NASAL | 0 refills | Status: AC
Start: 2017-02-26 — End: ?

## 2017-03-05 MED ORDER — FLUTICASONE 50 MCG/ACTUATION NASAL SPRAY, SUSP
50 mcg/actuation | NASAL | 0 refills | Status: DC
Start: 2017-03-05 — End: 2017-04-06

## 2017-03-10 LAB — LIPID PANEL
Cholesterol, total: 191 mg/dL (ref <200)
Cholesterol/HDL ratio: 2.4 calc (ref <5.0)
HDL Cholesterol: 81 mg/dL (ref >50)
LDL CHOL, CALCULATED: 95 mg/dL (ref <100)
Non-HDL Cholesterol: 110 mg/dL (calc) (ref <130)
Triglyceride: 64 mg/dL (ref <150)

## 2017-03-10 LAB — CBC WITH AUTOMATED DIFF
ABS. BASOPHILS: 36 Cells/mcL (ref 0–200)
ABS. EOSINOPHILS: 125 Cells/mcL (ref 15–500)
ABS. LYMPHOCYTES: 1456 Cells/mcL (ref 850–3900)
ABS. MONOCYTES: 395 Cells/mcL (ref 200–950)
ABS. NEUTROPHILS: 3188 Cells/mcL (ref 1500–7800)
BASOPHILS: 0.7 % (ref 0–2)
EOSINOPHILS: 2.4 % (ref 0–8)
HCT: 42.3 % (ref 35.0–45.0)
HGB: 13.6 g/dL (ref 11.7–15.5)
LYMPHOCYTES: 28 % (ref 15–49)
MCH: 29.5 pg (ref 27.0–33.0)
MCHC: 32.3 g/dL (ref 32.0–36.0)
MCV: 91.5 fL (ref 80.0–100.0)
MEAN PLATELET VOLUME: 10 fL (ref 7.5–12.5)
MONOCYTES: 7.6 % (ref 0–13)
Neutrophils: 61.3 % (ref 38–80)
PLATELET: 245 10*3/uL (ref 140–400)
RBC: 4.62 10*6/uL (ref 3.80–5.10)
RDW: 13.1 % (ref 11.0–15.0)
WBC: 5.2 10*3/uL (ref 3.8–10.8)

## 2017-03-10 LAB — METABOLIC PANEL, COMPREHENSIVE
ALB/GLOBRATIO: 1.7 (calc) (ref 1.0–2.5)
ALT (SGPT): 13 U/L (ref 6–29)
AST (SGOT): 18 U/L (ref 10–35)
Albumin: 4.2 g/dL (ref 3.6–5.1)
Alk. phosphatase: 62 U/L (ref 33–130)
BUN: 25 mg/dL (ref 7–25)
Bilirubin, total: 0.5 mg/dL (ref 0.2–1.2)
CO2: 30 mmol/L (ref 20–32)
Calcium: 9.6 mg/dL (ref 8.6–10.4)
Chloride: 105 mmol/L (ref 98–110)
Creatinine: 0.74 mg/dL (ref 0.50–0.99)
EGFR NON AFR AMERICAN: 84 mL/min/{1.73_m2} (ref >=60)
GFR est AA: 98 mL/min/{1.73_m2} (ref >=60)
Globulin: 2.5 g/dL (calc) (ref 1.9–3.7)
Glucose: 104 mg/dL — ABNORMAL HIGH (ref 65–99)
Potassium: 4.8 mmol/L (ref 3.5–5.3)
Protein, total: 6.7 g/dL (ref 6.1–8.1)
Sodium: 140 mmol/L (ref 135–146)

## 2017-03-10 LAB — HEMOGLOBIN A1C W/O EAG: Hemoglobin A1c: 5.8 % of total Hgb — ABNORMAL HIGH (ref <5.7)

## 2017-03-16 ENCOUNTER — Ambulatory Visit: Admit: 2017-03-16 | Discharge: 2017-03-16 | Payer: MEDICARE | Attending: Internal Medicine

## 2017-03-16 ENCOUNTER — Encounter

## 2017-03-16 DIAGNOSIS — K222 Esophageal obstruction: Secondary | ICD-10-CM

## 2017-03-16 MED ORDER — ALPRAZOLAM 0.5 MG TAB
0.5 mg | ORAL_TABLET | Freq: Every evening | ORAL | 0 refills | Status: DC | PRN
Start: 2017-03-16 — End: 2017-06-07

## 2017-03-16 NOTE — Progress Notes (Signed)
HISTORY OF PRESENT ILLNESS  Amy Cervantes is a 66 y.o. female she known to me very well since previous visit in general she is doing well recently she was diagnosed with colitis and some premalignant staff is going to her stomach will collect this evidence from gastroenterologist otherwise she is doing well no complaint of chest pain, shortness of breath abdominal pain no changes in mental status no changes in weight good appetite        Allergies   Allergen Reactions   ??? Iodinated Contrast- Oral And Iv Dye Unknown (comments)   ??? Levaquin [Levofloxacin] Palpitations   ??? Sulfur Other (comments)     Social History     Social History   ??? Marital status: MARRIED     Spouse name: N/A   ??? Number of children: N/A   ??? Years of education: N/A     Occupational History   ??? Not on file.     Social History Main Topics   ??? Smoking status: Former Smoker     Quit date: 03/11/1993   ??? Smokeless tobacco: Never Used   ??? Alcohol use Yes      Comment: occ   ??? Drug use: No   ??? Sexual activity: Not on file     Other Topics Concern   ??? Not on file     Social History Narrative     Past Medical History:   Diagnosis Date   ??? Anxiety    ??? Asthma    ??? Gastric ulcer     H. pylori negative   ??? GERD (gastroesophageal reflux disease)    ??? Hiatal hernia    ??? Hyperlipidemia    ??? Hypertension    ??? Insomnia    ??? Low back pain    ??? Near syncope    ??? Osteoporosis    ??? Palpitation    ??? Schatzki's ring     status post ballooning     Past Surgical History:   Procedure Laterality Date   ??? HX COLONOSCOPY     ??? HX ENDOSCOPY     ??? HX GYN      tubal ligation   ??? HX HEENT      deviated septum surgery   ??? HX ORTHOPAEDIC      toe surgery     Current Outpatient Prescriptions   Medication Sig Dispense Refill   ??? ALPRAZolam (XANAX) 0.5 mg tablet Take 1 Tab by mouth nightly as needed for Anxiety. Max Daily Amount: 0.5 mg. 30 Tab 0   ??? DEXILANT 60 mg CpDB TAKE 1 CAPSULE BY MOUTH EVERY DAY 30 Cap 3   ??? metoprolol succinate (TOPROL-XL) 25 mg XL tablet TAKE 1 TABLET EVERY DAY  30 Tab 3   ??? atorvastatin (LIPITOR) 10 mg tablet TAKE 1 TABLET BY MOUTH DAILY 30 Tab 3   ??? lisinopril (PRINIVIL, ZESTRIL) 5 mg tablet Take  by mouth daily.     ??? fluticasone (FLONASE) 50 mcg/actuation nasal spray SPRAY 2 SPRAYS INTO EACH NOSTRIL EVERY DAY 1 Bottle 0   ??? fluticasone (FLONASE) 50 mcg/actuation nasal spray SPRAY 2 SPRAYS INTO EACH NOSTRIL EVERY DAY 1 Bottle 0   ??? amoxicillin-clavulanate (AUGMENTIN) 875-125 mg per tablet Take 1 Tab by mouth every twelve (12) hours. 20 Tab 0   ??? fluticasone (FLONASE) 50 mcg/actuation nasal spray 2 Sprays by Both Nostrils route daily. 1 Bottle 0   ??? lisinopril (PRINIVIL, ZESTRIL) 10 mg tablet Take 10 mg by mouth daily.     ???  amoxicillin-clavulanate (AUGMENTIN) 875-125 mg per tablet Take 1 Tab by mouth every twelve (12) hours. 14 Tab 0   ??? methylPREDNISolone (MEDROL DOSEPACK) 4 mg tablet As directed on pack 1 Dose Pack 0   ??? promethazine-codeine (PHENERGAN WITH CODEINE) 6.25-10 mg/5 mL syrup Take 5 mL by mouth four (4) times daily as needed for Cough. Max Daily Amount: 20 mL. 120 mL 0   ??? azelastine (ASTELIN) 137 mcg (0.1 %) nasal spray INHALE TWO SPRAYS IN EACH NOSTRIL TWICE DAILY 1 Bottle 1       ROS  Review of Systems   Constitutional: Negative for chills, diaphoresis, fever, malaise/fatigue and weight loss.   HENT: Negative for ear discharge, ear pain and nosebleeds.    Eyes: Negative for blurred vision, double vision, photophobia, discharge and redness.   Respiratory: Negative for cough, hemoptysis, sputum production and shortness of breath.    Cardiovascular: Negative for chest pain, palpitations, orthopnea, claudication and leg swelling.   Gastrointestinal: Negative for abdominal pain, blood in stool, constipation, diarrhea, heartburn, melena, nausea and vomiting.   Genitourinary: Negative for dysuria, frequency, hematuria and urgency.   Musculoskeletal: Negative for back pain, joint pain, myalgias and neck pain.   Skin: Negative for itching and rash.    Neurological: Negative for dizziness, tingling, tremors, sensory change, speech change, seizures, loss of consciousness, weakness and headaches.   Psychiatric/Behavioral: Negative for depression, hallucinations, memory loss, substance abuse and suicidal ideas.       Physical Exam  Visit Vitals   ??? BP 120/70   ??? Pulse 76   ??? Temp 98.3 ??F (36.8 ??C) (Tympanic)   ??? Ht  (1.575 m)   ??? SpO2 99%       Physical Exam   Constitutional: She is oriented to person, place, and time and well-developed, well-nourished, and in no distress. No distress.   HENT:   Head: Normocephalic and atraumatic.   Right Ear: External ear normal.   Left Ear: External ear normal.   Nose: Nose normal.   Mouth/Throat: Oropharynx is clear and moist.   Eyes: Conjunctivae and EOM are normal. Pupils are equal, round, and reactive to light. Right eye exhibits no discharge. Left eye exhibits no discharge.   Neck: Normal range of motion. Neck supple. No JVD present. No tracheal deviation present. No thyromegaly present.   Cardiovascular: Normal rate, regular rhythm, normal heart sounds and intact distal pulses.  Exam reveals no gallop and no friction rub.    No murmur heard.  Pulmonary/Chest: Effort normal and breath sounds normal. No stridor. No respiratory distress. She has no wheezes. She has no rales. She exhibits no tenderness.   Abdominal: She exhibits no distension and no mass. There is no tenderness. There is no rebound and no guarding.   Musculoskeletal: Normal range of motion. She exhibits no edema, tenderness or deformity.   Lymphadenopathy:     She has no cervical adenopathy.   Neurological: She is alert and oriented to person, place, and time. She has normal reflexes. No cranial nerve deficit. She exhibits normal muscle tone. Gait normal. Coordination normal.   Skin: Skin is warm and dry. No rash noted. No erythema.   Psychiatric: Mood, affect and judgment normal.         ASSESSMENT /PLAN    ICD-10-CM ICD-9-CM     1. Schatzki's ring K22.2 750.3    2. Anxiety F41.9 300.00 ALPRAZolam (XANAX) 0.5 mg tablet   3. Palpitation R00.2 785.1    4. Near syncope R55 780.2  5. Essential hypertension I10 401.9    6. Pure hypercholesterolemia E78.00 272.0 CBC WITH AUTOMATED DIFF      METABOLIC PANEL, COMPREHENSIVE      LIPID PANEL      HEMOGLOBIN A1C W/O EAG   7. History of low back pain Z87.39 V13.59    8. Gastroesophageal reflux disease without esophagitis K21.9 530.81    9. Mild intermittent asthma with status asthmaticus J45.22 493.91      All medical problems are stable  Patient medically stable  Blood workup discussed with patient  Discussed about diabetic diet and low-cholesterol diet  I will see patient for follow-up in 3 months    Was given Xanax 0.5 once a day as needed #30, medication was checked with monitoring registry of state [place]

## 2017-04-06 ENCOUNTER — Encounter

## 2017-04-06 MED ORDER — FLUTICASONE 50 MCG/ACTUATION NASAL SPRAY, SUSP
50 mcg/actuation | NASAL | 1 refills | Status: AC
Start: 2017-04-06 — End: ?

## 2017-04-06 MED ORDER — ATORVASTATIN 10 MG TAB
10 mg | ORAL_TABLET | ORAL | 3 refills | Status: DC
Start: 2017-04-06 — End: 2017-06-29

## 2017-05-02 NOTE — Progress Notes (Signed)
Dr. Jonetta Osgoodroen's office provided the colon recall date

## 2017-06-01 MED ORDER — METOPROLOL SUCCINATE SR 25 MG 24 HR TAB
25 mg | ORAL_TABLET | ORAL | 3 refills | Status: DC
Start: 2017-06-01 — End: 2017-09-22

## 2017-06-07 ENCOUNTER — Ambulatory Visit: Admit: 2017-06-07 | Discharge: 2017-06-07 | Payer: MEDICARE | Attending: Internal Medicine

## 2017-06-07 DIAGNOSIS — I10 Essential (primary) hypertension: Secondary | ICD-10-CM

## 2017-06-07 MED ORDER — DEXLANSOPRAZOLE 60 MG CAPSULE,BIPHASE DELAYED RELEASE
60 mg | ORAL_CAPSULE | Freq: Every day | ORAL | 3 refills | Status: DC
Start: 2017-06-07 — End: 2017-10-01

## 2017-06-07 MED ORDER — ALPRAZOLAM 0.5 MG TAB
0.5 mg | ORAL_TABLET | Freq: Every evening | ORAL | 0 refills | Status: AC | PRN
Start: 2017-06-07 — End: ?

## 2017-06-07 NOTE — Progress Notes (Signed)
[facility]  Leia AlfBoris Shadae Reino, M.D  [address], [address]  [place] [id number]  Office:   Office Fax:        Pre-Procedure History & Physical Examination    Date: 06/07/17  Name: Amy DockerSusan Cervantes  D.O.B: [birth year]    Patient History  Proposed Procedure(s):    Chief Complaint:   Chief Complaint   Patient presents with   ??? Surgical Clearance       UEA:VWUJWHPI:Amy Cervantes is a 66 y.o. year old female who is here for medical clearence  At preasent time in stable medical condition, recently had UTI    Medications:   Current Outpatient Medications   Medication Sig Dispense Refill   ??? metoprolol succinate (TOPROL-XL) 25 mg XL tablet TAKE 1 TABLET BY MOUTH EVERY DAY 30 Tab 3   ??? atorvastatin (LIPITOR) 10 mg tablet TAKE 1 TABLET BY MOUTH DAILY 30 Tab 3   ??? ALPRAZolam (XANAX) 0.5 mg tablet Take 1 Tab by mouth nightly as needed for Anxiety. Max Daily Amount: 0.5 mg. 30 Tab 0   ??? DEXILANT 60 mg CpDB TAKE 1 CAPSULE BY MOUTH EVERY DAY 30 Cap 3   ??? lisinopril (PRINIVIL, ZESTRIL) 10 mg tablet Take 10 mg by mouth daily.     ??? fluticasone (FLONASE) 50 mcg/actuation nasal spray USE 2 SPRAYS INTO EACH NOSTRIL EVERY DAY 1 Bottle 1   ??? fluticasone (FLONASE) 50 mcg/actuation nasal spray SPRAY 2 SPRAYS INTO EACH NOSTRIL EVERY DAY 1 Bottle 0   ??? amoxicillin-clavulanate (AUGMENTIN) 875-125 mg per tablet Take 1 Tab by mouth every twelve (12) hours. 20 Tab 0   ??? fluticasone (FLONASE) 50 mcg/actuation nasal spray 2 Sprays by Both Nostrils route daily. 1 Bottle 0   ??? lisinopril (PRINIVIL, ZESTRIL) 5 mg tablet Take  by mouth daily.     ??? amoxicillin-clavulanate (AUGMENTIN) 875-125 mg per tablet Take 1 Tab by mouth every twelve (12) hours. 14 Tab 0   ??? methylPREDNISolone (MEDROL DOSEPACK) 4 mg tablet As directed on pack 1 Dose Pack 0   ??? promethazine-codeine (PHENERGAN WITH CODEINE) 6.25-10 mg/5 mL syrup Take 5 mL by mouth four (4) times daily as needed for Cough. Max Daily Amount: 20 mL. 120 mL 0    ??? azelastine (ASTELIN) 137 mcg (0.1 %) nasal spray INHALE TWO SPRAYS IN EACH NOSTRIL TWICE DAILY 1 Bottle 1     Patient is tolerating all medications as prescribed with no barriers to adherence noted.    Past Medical History:   Past Medical History:   Diagnosis Date   ??? Anxiety    ??? Asthma    ??? Gastric ulcer     H. pylori negative   ??? GERD (gastroesophageal reflux disease)    ??? Hiatal hernia    ??? Hyperlipidemia    ??? Hypertension    ??? Insomnia    ??? Low back pain    ??? Near syncope    ??? Osteoporosis    ??? Palpitation    ??? Schatzki's ring     status post ballooning       Past Surgical History:   Past Surgical History:   Procedure Laterality Date   ??? HX COLONOSCOPY     ??? HX ENDOSCOPY     ??? HX GYN      tubal ligation   ??? HX HEENT      deviated septum surgery   ??? HX ORTHOPAEDIC      toe surgery  Allergies:   Allergies   Allergen Reactions   ??? Iodinated Contrast- Oral And Iv Dye Unknown (comments)   ??? Levaquin [Levofloxacin] Palpitations   ??? Sulfur Other (comments)       Social History:  Social History     Tobacco Use   Smoking Status Former Smoker   ??? Last attempt to quit: 03/11/1993   ??? Years since quitting: 24.2   Smokeless Tobacco Never Used     Social History     Substance and Sexual Activity   Drug Use No     Social History     Substance and Sexual Activity   Alcohol Use Yes    Comment: occ           ROS  .Review of Systems   Constitutional: Negative for chills, diaphoresis, fever, malaise/fatigue and weight loss.   HENT: Negative for ear discharge, ear pain and nosebleeds.    Eyes: Negative for blurred vision, double vision, photophobia, discharge and redness.   Respiratory: Negative for cough, hemoptysis, sputum production and shortness of breath.    Cardiovascular: Negative for chest pain, palpitations, orthopnea, claudication and leg swelling.   Gastrointestinal: Negative for abdominal pain, blood in stool, constipation, diarrhea, heartburn, melena, nausea and vomiting.    Genitourinary: Negative for dysuria, frequency, hematuria and urgency.   Musculoskeletal: Negative for back pain, joint pain, myalgias and neck pain.   Skin: Negative for itching and rash.   Neurological: Negative for dizziness, tingling, tremors, sensory change, speech change, seizures, loss of consciousness, weakness and headaches.   Psychiatric/Behavioral: Negative for depression, hallucinations, memory loss, substance abuse and suicidal ideas.       Physical Examination  Vitals:  Visit Vitals  BP 140/90 (BP Patient Position: Sitting)   Pulse 61   Temp 99 ??F (37.2 ??C) (Tympanic)   Ht 5\' 2"  (1.575 m)   SpO2 99%   BMI 26.89 kg/m??     Body mass index is 26.89 kg/m??.  Physical Exam   Constitutional: She is oriented to person, place, and time. She appears well-developed and well-nourished.   HENT:   Head: Normocephalic.   Eyes: Pupils are equal, round, and reactive to light.   Neck: Normal range of motion. Neck supple. No thyromegaly present.   Cardiovascular: Normal rate, regular rhythm, normal heart sounds and intact distal pulses. Exam reveals no friction rub.   No murmur heard.  Pulmonary/Chest: Breath sounds normal. No respiratory distress. She has no wheezes. She has no rales.   Abdominal: Soft. Bowel sounds are normal. There is no tenderness. There is no rebound.   Musculoskeletal: Normal range of motion. She exhibits no edema.   Neurological: She is alert and oriented to person, place, and time.   Skin: Skin is warm and dry. No rash noted.   Psychiatric: She has a normal mood and affect. Judgment normal.           Laboratory Data & Studies     Lab Data Reviewed:included revaised    EKG: included              CXR: no need    Assessment  Patient at present time in stable medical condition, medically cleared for surgery        Leia AlfBoris Keondrick Dilks, MD

## 2017-06-16 LAB — LIPID PANEL
Cholesterol, total: 179 mg/dL (ref <200)
Cholesterol/HDL ratio: 2.1 calc (ref <5.0)
HDL Cholesterol: 84 mg/dL (ref >50)
LDL CHOL, CALCULATED: 80 mg/dL (ref <100)
Non-HDL Cholesterol: 95 mg/dL (calc) (ref <130)
Triglyceride: 73 mg/dL (ref <150)

## 2017-06-16 LAB — CBC WITH AUTOMATED DIFF
ABS. BASOPHILS: 36 Cells/mcL (ref 0–200)
ABS. EOSINOPHILS: 82 Cells/mcL (ref 15–500)
ABS. LYMPHOCYTES: 1301 Cells/mcL (ref 850–3900)
ABS. MONOCYTES: 388 Cells/mcL (ref 200–950)
ABS. NEUTROPHILS: 3295 Cells/mcL (ref 1500–7800)
BASOPHILS: 0.7 % (ref 0–2)
EOSINOPHILS: 1.6 % (ref 0–8)
HCT: 42.8 % (ref 35.0–45.0)
HGB: 13.8 g/dL (ref 11.7–15.5)
LYMPHOCYTES: 25.5 % (ref 15–49)
MCH: 29.1 pg (ref 27.0–33.0)
MCHC: 32.2 g/dL (ref 32.0–36.0)
MCV: 90.4 fL (ref 80.0–100.0)
MEAN PLATELET VOLUME: 9.4 fL (ref 7.5–12.5)
MONOCYTES: 7.6 % (ref 0–13)
Neutrophils: 64.6 % (ref 38–80)
PLATELET: 260 10*3/uL (ref 140–400)
RBC: 4.73 10*6/uL (ref 3.80–5.10)
RDW: 14.1 % (ref 11.0–15.0)
WBC: 5.1 10*3/uL (ref 3.8–10.8)

## 2017-06-16 LAB — METABOLIC PANEL, COMPREHENSIVE
ALB/GLOBRATIO: 1.7 (calc) (ref 1.0–2.5)
ALT (SGPT): 14 U/L (ref 6–29)
AST (SGOT): 16 U/L (ref 10–35)
Albumin: 4.5 g/dL (ref 3.6–5.1)
Alk. phosphatase: 62 U/L (ref 33–130)
BUN: 23 mg/dL (ref 7–25)
Bilirubin, total: 0.9 mg/dL (ref 0.2–1.2)
CO2: 27 mmol/L (ref 20–32)
Calcium: 10 mg/dL (ref 8.6–10.4)
Chloride: 103 mmol/L (ref 98–110)
Creatinine: 0.8 mg/dL (ref 0.50–0.99)
EGFR NON AFR AMERICAN: 76 mL/min/{1.73_m2} (ref >=60)
GFR est AA: 88 mL/min/{1.73_m2} (ref >=60)
Globulin: 2.7 g/dL (calc) (ref 1.9–3.7)
Glucose: 90 mg/dL (ref 65–99)
Potassium: 4.7 mmol/L (ref 3.5–5.3)
Protein, total: 7.2 g/dL (ref 6.1–8.1)
Sodium: 138 mmol/L (ref 135–146)

## 2017-06-16 LAB — HEMOGLOBIN A1C W/O EAG: Hemoglobin A1c: 5.8 % of total Hgb — ABNORMAL HIGH (ref <5.7)

## 2017-06-22 ENCOUNTER — Ambulatory Visit: Admit: 2017-06-22 | Discharge: 2017-06-22 | Payer: MEDICARE | Attending: Internal Medicine

## 2017-06-22 DIAGNOSIS — K222 Esophageal obstruction: Secondary | ICD-10-CM

## 2017-06-22 NOTE — Progress Notes (Signed)
HISTORY OF PRESENT ILLNESS  Amy Cervantes is a 67 y.o. female known to me very well since previous visits she has a history of multiple medical problems at present time she is doing well except having headaches no dizziness no temperature no changes neurological        Allergies   Allergen Reactions   ??? Iodinated Contrast- Oral And Iv Dye Unknown (comments)   ??? Levaquin [Levofloxacin] Palpitations   ??? Sulfur Other (comments)     Social History     Socioeconomic History   ??? Marital status: MARRIED     Spouse name: Not on file   ??? Number of children: Not on file   ??? Years of education: Not on file   ??? Highest education level: Not on file   Social Needs   ??? Financial resource strain: Not on file   ??? Food insecurity - worry: Not on file   ??? Food insecurity - inability: Not on file   ??? Transportation needs - medical: Not on file   ??? Transportation needs - non-medical: Not on file   Occupational History   ??? Not on file   Tobacco Use   ??? Smoking status: Former Smoker     Last attempt to quit: 03/11/1993     Years since quitting: 24.2   ??? Smokeless tobacco: Never Used   Substance and Sexual Activity   ??? Alcohol use: Yes     Comment: occ   ??? Drug use: No   ??? Sexual activity: Not on file   Other Topics Concern   ??? Not on file   Social History Narrative   ??? Not on file     Past Medical History:   Diagnosis Date   ??? Anxiety    ??? Asthma    ??? Gastric ulcer     H. pylori negative   ??? GERD (gastroesophageal reflux disease)    ??? Hiatal hernia    ??? Hyperlipidemia    ??? Hypertension    ??? Insomnia    ??? Low back pain    ??? Near syncope    ??? Osteoporosis    ??? Palpitation    ??? Schatzki's ring     status post ballooning     Past Surgical History:   Procedure Laterality Date   ??? HX COLONOSCOPY     ??? HX ENDOSCOPY     ??? HX GYN      tubal ligation   ??? HX HEENT      deviated septum surgery   ??? HX ORTHOPAEDIC      toe surgery     Current Outpatient Medications   Medication Sig Dispense Refill   ??? AMOXICILLIN PO Take  by mouth.      ??? GATIFLOXACIN OP Apply 1 Drop to eye.     ??? ALPRAZolam (XANAX) 0.5 mg tablet Take 1 Tab by mouth nightly as needed for Anxiety. Max Daily Amount: 0.5 mg. 30 Tab 0   ??? dexlansoprazole (DEXILANT) 60 mg CpDB capsule (delayed release) 1 Cap by Miscellaneous route daily. 30 Cap 3   ??? metoprolol succinate (TOPROL-XL) 25 mg XL tablet TAKE 1 TABLET BY MOUTH EVERY DAY 30 Tab 3   ??? atorvastatin (LIPITOR) 10 mg tablet TAKE 1 TABLET BY MOUTH DAILY 30 Tab 3   ??? fluticasone (FLONASE) 50 mcg/actuation nasal spray USE 2 SPRAYS INTO EACH NOSTRIL EVERY DAY 1 Bottle 1   ??? fluticasone (FLONASE) 50 mcg/actuation nasal spray SPR[facility]  [facility] DAY 1 Bottle 0   ??? fluticasone (FLONASE) 50 mcg/actuation nasal spray 2 Sprays by Both Nostrils route daily. 1 Bottle 0   ??? lisinopril (PRINIVIL, ZESTRIL) 10 mg tablet Take 10 mg by mouth daily.     ??? azelastine (ASTELIN) 137 mcg (0.1 %) nasal spray INHALE TWO SPRAYS IN EACH NOSTRIL TWICE DAILY 1 Bottle 1   ??? amoxicillin-clavulanate (AUGMENTIN) 875-125 mg per tablet Take 1 Tab by mouth every twelve (12) hours. 20 Tab 0   ??? lisinopril (PRINIVIL, ZESTRIL) 5 mg tablet Take  by mouth daily.     ??? amoxicillin-clavulanate (AUGMENTIN) 875-125 mg per tablet Take 1 Tab by mouth every twelve (12) hours. 14 Tab 0   ??? methylPREDNISolone (MEDROL DOSEPACK) 4 mg tablet As directed on pack 1 Dose Pack 0   ??? promethazine-codeine (PHENERGAN WITH CODEINE) 6.25-10 mg/5 mL syrup Take 5 mL by mouth four (4) times daily as needed for Cough. Max Daily Amount: 20 mL. 120 mL 0     ROS  Review of Systems   Constitutional: Negative for chills, diaphoresis, fever, malaise/fatigue and weight loss.   HENT: Negative for ear discharge, ear pain and nosebleeds.    Eyes: Negative for blurred vision, double vision, photophobia, discharge and redness.   Respiratory: Negative for cough, hemoptysis, sputum production and shortness of breath.    Cardiovascular: Negative for chest pain, palpitations, orthopnea,  claudication and leg swelling.   Gastrointestinal: Negative for abdominal pain, blood in stool, constipation, diarrhea, heartburn, melena, nausea and vomiting.   Genitourinary: Negative for dysuria, frequency, hematuria and urgency.   Musculoskeletal: Negative for back pain, joint pain, myalgias and neck pain.   Skin: Negative for itching and rash.   Neurological: Negative for dizziness, tingling, tremors, sensory change, speech change, seizures, loss of consciousness, weakness and headaches.   Psychiatric/Behavioral: Negative for depression, hallucinations, memory loss, substance abuse and suicidal ideas.       Physical Exam  Visit Vitals  BP 120/70   Pulse 72   Temp 98.3 ??F (36.8 ??C) (Tympanic)   Ht 5\' 2"  (1.575 m)   Wt 148 lb (67.1 kg)   SpO2 98%   BMI 27.07 kg/m??       Physical Exam   Constitutional: She is oriented to person, place, and time and well-developed, well-nourished, and in no distress. No distress.   HENT:   Head: Normocephalic and atraumatic.   Right Ear: External ear normal.   Left Ear: External ear normal.   Nose: Nose normal.   Mouth/Throat: Oropharynx is clear and moist.   Eyes: Conjunctivae and EOM are normal. Pupils are equal, round, and reactive to light. Right eye exhibits no discharge. Left eye exhibits no discharge.   Neck: Normal range of motion. Neck supple. No JVD present. No tracheal deviation present. No thyromegaly present.   Cardiovascular: Normal rate, regular rhythm, normal heart sounds and intact distal pulses. Exam reveals no gallop and no friction rub.   No murmur heard.  Pulmonary/Chest: Effort normal and breath sounds normal. No stridor. No respiratory distress. She has no wheezes. She has no rales. She exhibits no tenderness.   Abdominal: She exhibits no distension and no mass. There is no tenderness. There is no rebound and no guarding.   Musculoskeletal: Normal range of motion. She exhibits no edema, tenderness or deformity.   Lymphadenopathy:      She has no cervical adenopathy.   Neurological: She is alert and oriented to person, place, and time. She has normal  reflexes. No cranial nerve deficit. She exhibits normal muscle tone. Gait normal. Coordination normal.   Skin: Skin is warm and dry. No rash noted. No erythema.   Psychiatric: Mood, affect and judgment normal.         ASSESSMENT /PLAN    ICD-10-CM ICD-9-CM    1. Schatzki's ring K22.2 750.3    2. Palpitation R00.2 785.1    3. Essential hypertension I10 401.9    4. Pure hypercholesterolemia E78.00 272.0    5. History of low back pain Z87.39 V13.59    6. Mild intermittent asthma without complication J45.20 493.90      Was seen and evaluated doing well, need to see ENT  Blood workup discussed with patient's actually satisfactory good response to Crestor and good response with diet regarding diabetes sugar

## 2017-06-29 ENCOUNTER — Encounter

## 2017-06-29 MED ORDER — ATORVASTATIN 10 MG TAB
10 mg | ORAL_TABLET | ORAL | 0 refills | Status: DC
Start: 2017-06-29 — End: 2017-07-26

## 2017-07-17 ENCOUNTER — Ambulatory Visit: Admit: 2017-07-17 | Discharge: 2017-07-17 | Payer: MEDICARE | Attending: Internal Medicine

## 2017-07-17 DIAGNOSIS — K222 Esophageal obstruction: Secondary | ICD-10-CM

## 2017-07-17 MED ORDER — LEVALBUTEROL HFA 45 MCG/ACTUATION AEROSOL INHALER
45 mcg/actuation | Freq: Four times a day (QID) | RESPIRATORY_TRACT | 0 refills | Status: AC | PRN
Start: 2017-07-17 — End: ?

## 2017-07-17 MED ORDER — BUDESONIDE 90 MCG/INHALATION BREATH ACTIVATED POWDER AEROSOL
90 mcg/actuation | Freq: Two times a day (BID) | RESPIRATORY_TRACT | 0 refills | Status: DC
Start: 2017-07-17 — End: 2017-08-27

## 2017-07-17 MED ORDER — ALPRAZOLAM 0.5 MG TAB
0.5 mg | ORAL_TABLET | Freq: Every day | ORAL | 0 refills | Status: AC
Start: 2017-07-17 — End: ?

## 2017-07-17 NOTE — Progress Notes (Signed)
HISTORY OF PRESENT ILLNESS  Amy Cervantes is a 67 y.o. female patient known to me very well since previous visit she has history multiple medical problems at present time she presented with cough wheezing some shortness of breath no complaint of chest pain abdominal pain no temperature she is to complete antibiotics she is on Augmentin, no evidence neurovascular deficiency no changes in weight good appetite        Allergies   Allergen Reactions   ??? Iodinated Contrast- Oral And Iv Dye Unknown (comments)   ??? Levaquin [Levofloxacin] Palpitations   ??? Sulfur Other (comments)     Social History     Socioeconomic History   ??? Marital status: MARRIED     Spouse name: Not on file   ??? Number of children: Not on file   ??? Years of education: Not on file   ??? Highest education level: Not on file   Social Needs   ??? Financial resource strain: Not on file   ??? Food insecurity - worry: Not on file   ??? Food insecurity - inability: Not on file   ??? Transportation needs - medical: Not on file   ??? Transportation needs - non-medical: Not on file   Occupational History   ??? Not on file   Tobacco Use   ??? Smoking status: Former Smoker     Last attempt to quit: 03/11/1993     Years since quitting: 24.3   ??? Smokeless tobacco: Never Used   Substance and Sexual Activity   ??? Alcohol use: Yes     Comment: occ   ??? Drug use: No   ??? Sexual activity: Not on file   Other Topics Concern   ??? Not on file   Social History Narrative   ??? Not on file     Past Medical History:   Diagnosis Date   ??? Anxiety    ??? Asthma    ??? Gastric ulcer     H. pylori negative   ??? GERD (gastroesophageal reflux disease)    ??? Hiatal hernia    ??? Hyperlipidemia    ??? Hypertension    ??? Insomnia    ??? Low back pain    ??? Near syncope    ??? Osteoporosis    ??? Palpitation    ??? Schatzki's ring     status post ballooning     Past Surgical History:   Procedure Laterality Date   ??? HX COLONOSCOPY     ??? HX ENDOSCOPY     ??? HX GYN      tubal ligation   ??? HX HEENT      deviated septum surgery    ??? HX ORTHOPAEDIC      toe surgery     Current Outpatient Medications   Medication Sig Dispense Refill   ??? levalbuterol tartrate (XOPENEX) 45 mcg/actuation inhaler Take 2 Puffs by inhalation every six (6) hours as needed for Wheezing. 1 Inhaler 0   ??? budesonide (PULMICORT FLEXHALER) 90 mcg/actuation aepb inhaler Take 2 Puffs by inhalation two (2) times a day. 1 Inhaler 0   ??? ALPRAZolam (XANAX) 0.5 mg tablet Take 1 Tab by mouth daily. Max Daily Amount: 0.5 mg. 30 Tab 0   ??? atorvastatin (LIPITOR) 10 mg tablet TAKE 1 TABLET BY MOUTH DAILY 30 Tab 0   ??? ALPRAZolam (XANAX) 0.5 mg tablet Take 1 Tab by mouth nightly as needed for Anxiety. Max Daily Amount: 0.5 mg. 30 Tab 0   ??? dexlansoprazole (DEXILANT)  60 mg CpDB capsule (delayed release) 1 Cap by Miscellaneous route daily. 30 Cap 3   ??? metoprolol succinate (TOPROL-XL) 25 mg XL tablet TAKE 1 TABLET BY MOUTH EVERY DAY 30 Tab 3   ??? fluticasone (FLONASE) 50 mcg/actuation nasal spray USE 2 SPRAYS INTO EACH NOSTRIL EVERY DAY 1 Bottle 1   ??? fluticasone (FLONASE) 50 mcg/actuation nasal spray SPRAY 2 SPRAYS INTO EACH NOSTRIL EVERY DAY 1 Bottle 0   ??? amoxicillin-clavulanate (AUGMENTIN) 875-125 mg per tablet Take 1 Tab by mouth every twelve (12) hours. 20 Tab 0   ??? fluticasone (FLONASE) 50 mcg/actuation nasal spray 2 Sprays by Both Nostrils route daily. 1 Bottle 0   ??? amoxicillin-clavulanate (AUGMENTIN) 875-125 mg per tablet Take 1 Tab by mouth every twelve (12) hours. 14 Tab 0   ??? methylPREDNISolone (MEDROL DOSEPACK) 4 mg tablet As directed on pack 1 Dose Pack 0   ??? azelastine (ASTELIN) 137 mcg (0.1 %) nasal spray INHALE TWO SPRAYS IN EACH NOSTRIL TWICE DAILY 1 Bottle 1   ??? AMOXICILLIN PO Take  by mouth.     ??? GATIFLOXACIN OP Apply 1 Drop to eye.     ??? lisinopril (PRINIVIL, ZESTRIL) 5 mg tablet Take  by mouth daily.     ??? lisinopril (PRINIVIL, ZESTRIL) 10 mg tablet Take 10 mg by mouth daily.     ??? promethazine-codeine (PHENERGAN WITH CODEINE) 6.25-10 mg/5 mL syrup Take  5 mL by mouth four (4) times daily as needed for Cough. Max Daily Amount: 20 mL. 120 mL 0     ROS  Review of Systems   Constitutional: Negative for chills, diaphoresis, fever, malaise/fatigue and weight loss.   HENT: Negative for ear discharge, ear pain and nosebleeds.    Eyes: Negative for blurred vision, double vision, photophobia, discharge and redness.   Respiratory: Positive for cough and wheezing. Negative for hemoptysis, sputum production and shortness of breath.    Cardiovascular: Negative for chest pain, palpitations, orthopnea, claudication and leg swelling.   Gastrointestinal: Negative for abdominal pain, blood in stool, constipation, diarrhea, heartburn, melena, nausea and vomiting.   Genitourinary: Negative for dysuria, frequency, hematuria and urgency.   Musculoskeletal: Negative for back pain, joint pain, myalgias and neck pain.   Skin: Negative for itching and rash.   Neurological: Negative for dizziness, tingling, tremors, sensory change, speech change, seizures, loss of consciousness, weakness and headaches.   Psychiatric/Behavioral: Negative for depression, hallucinations, memory loss, substance abuse and suicidal ideas.       Physical Exam  Visit Vitals  BP 134/80   Pulse 72   Temp 99.3 ??F (37.4 ??C) (Tympanic)   Ht 5\' 2"  (1.575 m)   SpO2 99%   BMI 27.07 kg/m??       Physical Exam   Constitutional: She is oriented to person, place, and time and well-developed, well-nourished, and in no distress. No distress.   HENT:   Head: Normocephalic and atraumatic.   Right Ear: External ear normal.   Left Ear: External ear normal.   Nose: Nose normal.   Mouth/Throat: Oropharynx is clear and moist.   Eyes: Conjunctivae and EOM are normal. Pupils are equal, round, and reactive to light. Right eye exhibits no discharge. Left eye exhibits no discharge.   Neck: Normal range of motion. Neck supple. No JVD present. No tracheal deviation present. No thyromegaly present.    Cardiovascular: Normal rate, regular rhythm, normal heart sounds and intact distal pulses. Exam reveals no gallop and no friction rub.   No murmur  heard.  Pulmonary/Chest: Effort normal and breath sounds normal. No stridor. No respiratory distress. She has no wheezes. She has no rales. She exhibits no tenderness.   Abdominal: She exhibits no distension and no mass. There is no tenderness. There is no rebound and no guarding.   Musculoskeletal: Normal range of motion. She exhibits no edema, tenderness or deformity.   Lymphadenopathy:     She has no cervical adenopathy.   Neurological: She is alert and oriented to person, place, and time. She has normal reflexes. No cranial nerve deficit. She exhibits normal muscle tone. Gait normal. Coordination normal.   Skin: Skin is warm and dry. No rash noted. No erythema.   Psychiatric: Mood, affect and judgment normal.         ASSESSMENT /PLAN    ICD-10-CM ICD-9-CM    1. Schatzki's ring K22.2 750.3    2. Palpitation R00.2 785.1    3. Essential hypertension I10 401.9    4. Pure hyperglyceridemia E78.1 272.1    5. History of low back pain Z87.39 V13.59    6. Gastroesophageal reflux disease without esophagitis K21.9 530.81    7. Mild intermittent asthma without complication J45.20 493.90 levalbuterol tartrate (XOPENEX) 45 mcg/actuation inhaler      budesonide (PULMICORT FLEXHALER) 90 mcg/actuation aepb inhaler   8. Anxiety F41.9 300.00 ALPRAZolam (XANAX) 0.5 mg tablet     Exacerbation of asthma given Xopenex and state albuterol 2 puffs every 6 as needed  Continue inhalers and Pulmicort 92 puffs twice a day  Patient was given her Xanax 0.5 mg p.o. daily as needed, medication was checked with [place] prescription monitoring program registry  We will see her in a week

## 2017-07-26 ENCOUNTER — Encounter

## 2017-07-26 MED ORDER — ATORVASTATIN 10 MG TAB
10 mg | ORAL_TABLET | ORAL | 0 refills | Status: DC
Start: 2017-07-26 — End: 2017-09-03

## 2017-08-27 ENCOUNTER — Encounter

## 2017-08-29 MED ORDER — PULMICORT FLEXHALER 90 MCG/ACTUATION BREATH ACTIVATED
90 mcg/actuation | RESPIRATORY_TRACT | 0 refills | Status: AC
Start: 2017-08-29 — End: ?

## 2017-09-03 ENCOUNTER — Encounter

## 2017-09-03 MED ORDER — ATORVASTATIN 10 MG TAB
10 mg | ORAL_TABLET | ORAL | 0 refills | Status: DC
Start: 2017-09-03 — End: 2017-11-25

## 2017-09-24 MED ORDER — METOPROLOL SUCCINATE SR 25 MG 24 HR TAB
25 mg | ORAL_TABLET | ORAL | 0 refills | Status: DC
Start: 2017-09-24 — End: 2017-10-23

## 2017-10-01 MED ORDER — DEXILANT 60 MG CAPSULE, DELAYED RELEASE
60 mg | ORAL_CAPSULE | ORAL | 0 refills | Status: DC
Start: 2017-10-01 — End: 2017-11-01

## 2017-10-23 MED ORDER — METOPROLOL SUCCINATE SR 25 MG 24 HR TAB
25 mg | ORAL_TABLET | ORAL | 0 refills | Status: DC
Start: 2017-10-23 — End: 2017-11-19

## 2017-11-01 MED ORDER — DEXILANT 60 MG CAPSULE, DELAYED RELEASE
60 mg | ORAL_CAPSULE | ORAL | 0 refills | Status: DC
Start: 2017-11-01 — End: 2017-12-05

## 2017-11-19 MED ORDER — METOPROLOL SUCCINATE SR 25 MG 24 HR TAB
25 mg | ORAL_TABLET | ORAL | 0 refills | Status: AC
Start: 2017-11-19 — End: ?

## 2017-11-25 ENCOUNTER — Encounter

## 2017-11-26 MED ORDER — ATORVASTATIN 10 MG TAB
10 mg | ORAL_TABLET | ORAL | 0 refills | Status: AC
Start: 2017-11-26 — End: ?

## 2017-12-06 MED ORDER — DEXILANT 60 MG CAPSULE, DELAYED RELEASE
60 mg | ORAL_CAPSULE | ORAL | 0 refills | Status: AC
Start: 2017-12-06 — End: ?

## 2020-07-18 IMAGING — MR MRI CERVICAL SPINE WITHOUT CONTRAST
4 series · 31 of 48 positions shown · IV contrast (gadolinium)
Comparison: None.

MRI CERVICAL SPINE WITHOUT CONTRAST, 07/18/2020 [DATE]: 
CLINICAL INDICATION: Chronic neck pain radiating to left arm.
TECHNIQUE: Multiplanar, multiecho position MR images of the cervical spine were 
performed without intravenous gadolinium enhancement.

[Series 101: survey · axial · 10.0mm · 0.94mm/px · z∈[-15,+150]mm · 6 of 9 slices shown]
[im 1/9]
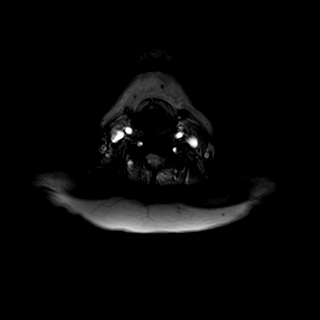
[im 2/9]
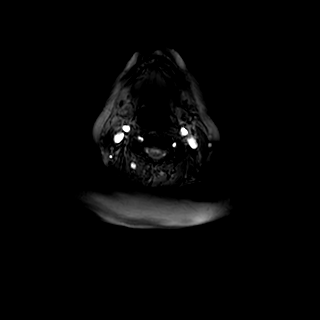
[im 4/9]
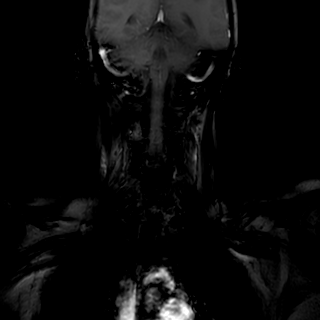
[im 5/9]
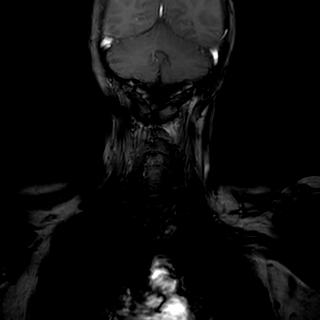
[im 7/9]
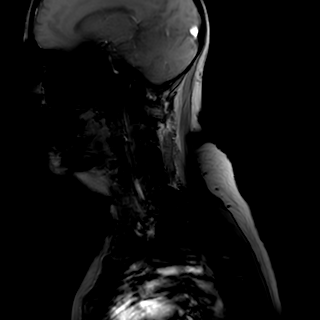
[im 9/9]
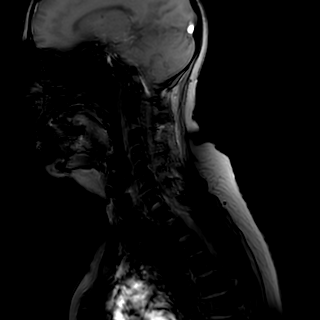

[Series 301: t1w_tse sag · sagittal · 3.0mm · 0.46mm/px · 9 of 15 slices shown]
[im 1/15]
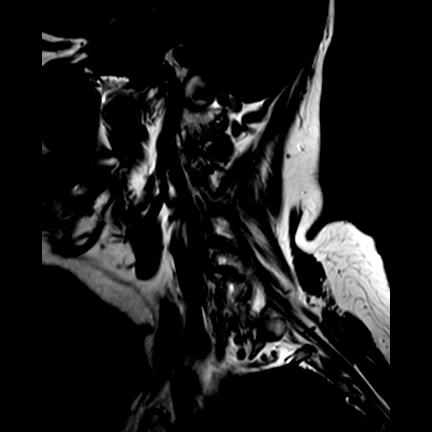
[im 2/15]
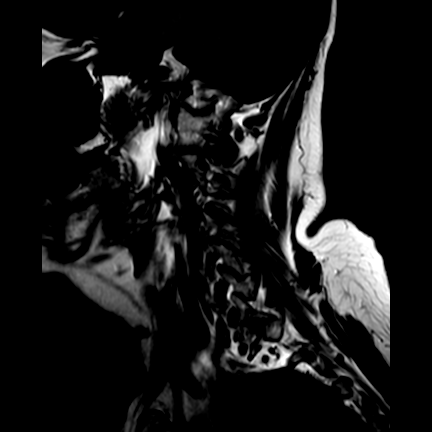
[im 4/15]
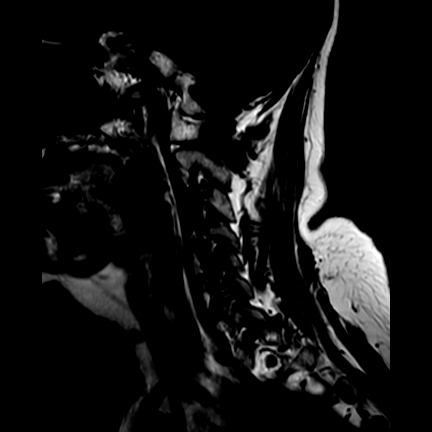
[im 6/15]
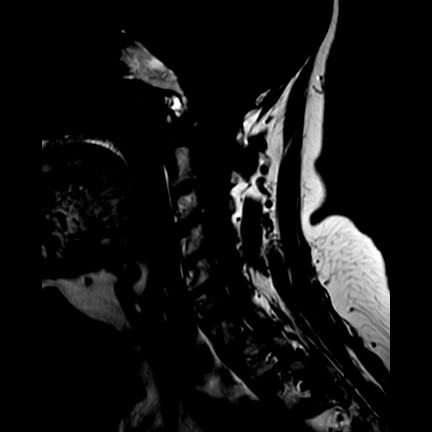
[im 8/15]
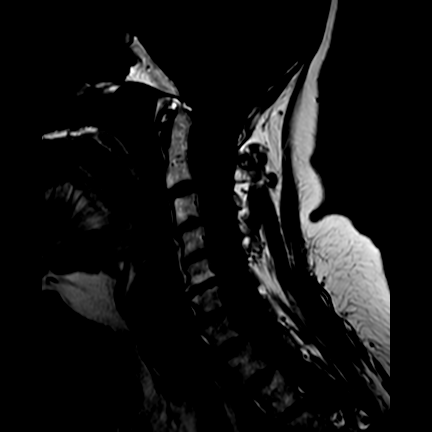
[im 9/15]
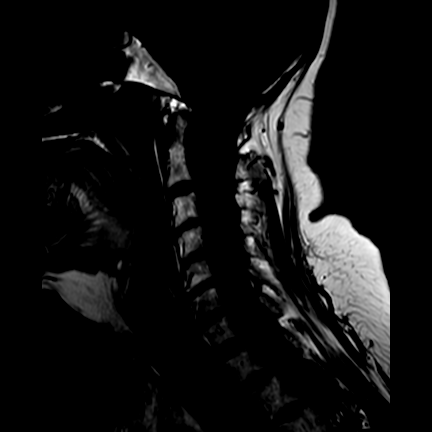
[im 11/15]
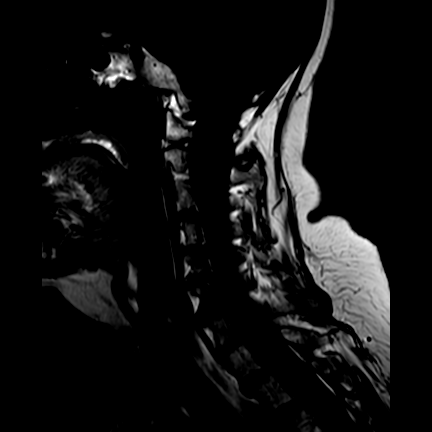
[im 13/15]
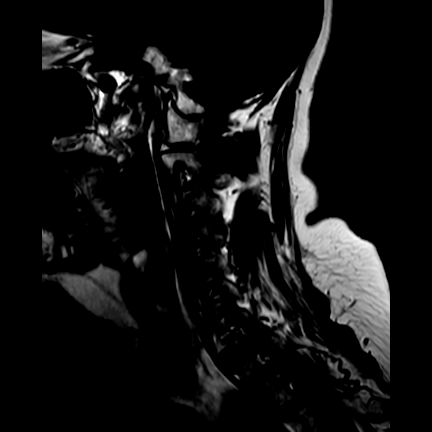
[im 15/15]
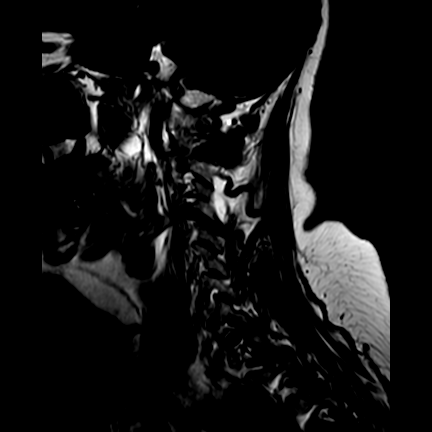

[Series 401: t2w_tse sag · sagittal · 3.0mm · 0.50mm/px · 9 of 15 slices shown]
[im 1/15]
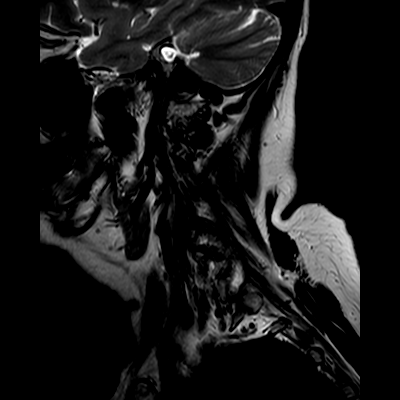
[im 2/15]
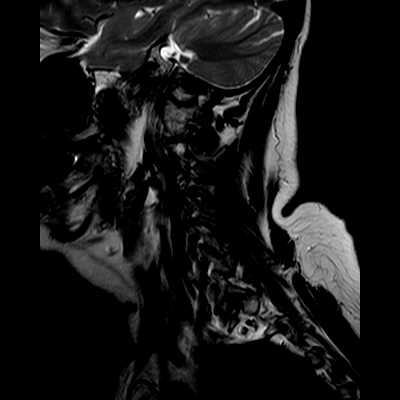
[im 4/15]
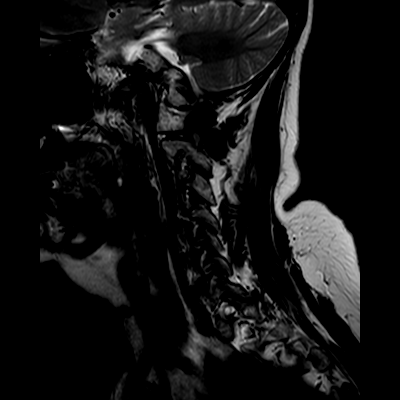
[im 6/15]
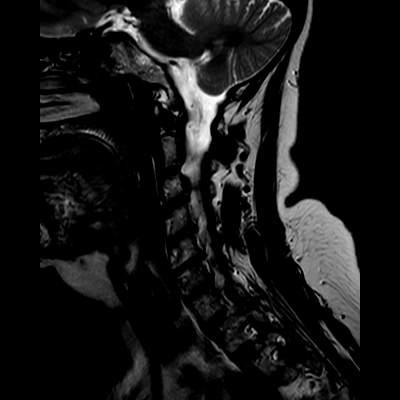
[im 8/15]
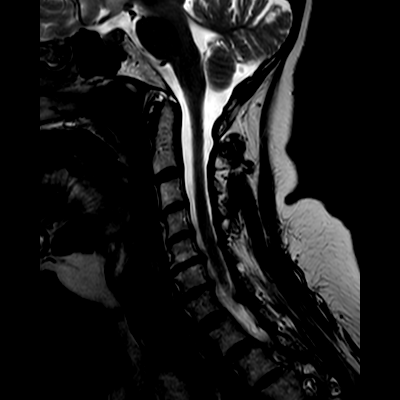
[im 9/15]
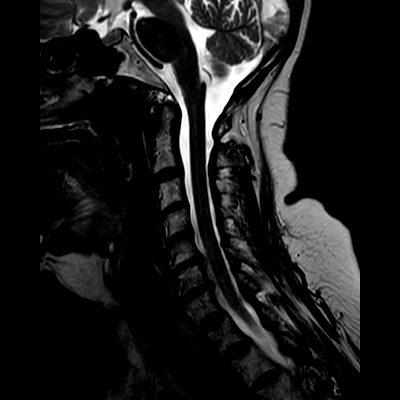
[im 11/15]
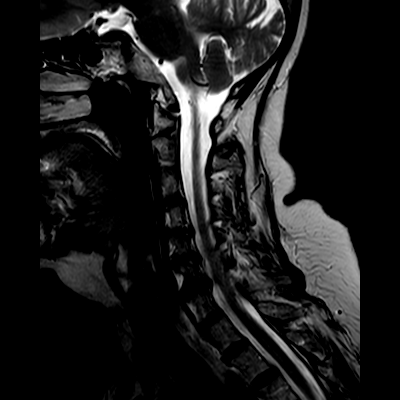
[im 13/15]
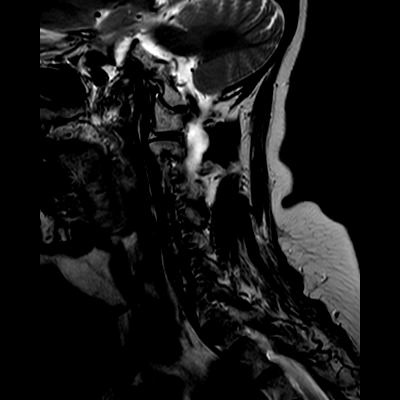
[im 15/15]
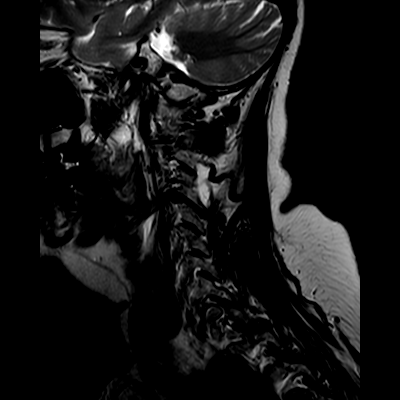

[Series 601: t2w_tse_ax · axial · 3.0mm · 0.36mm/px · z∈[-61,+40]mm · 7 of 40 slices shown]
[im 2/40]
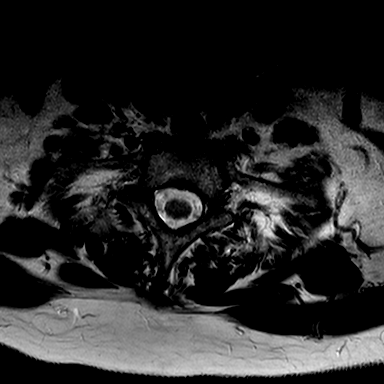
[im 7/40]
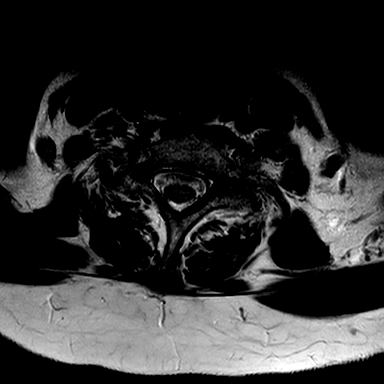
[im 12/40]
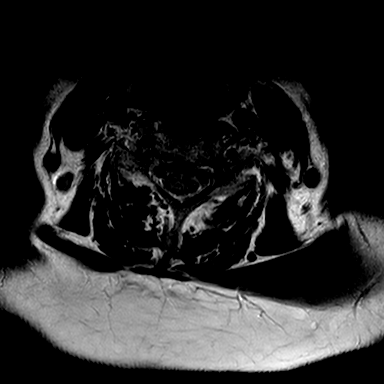
[im 17/40]
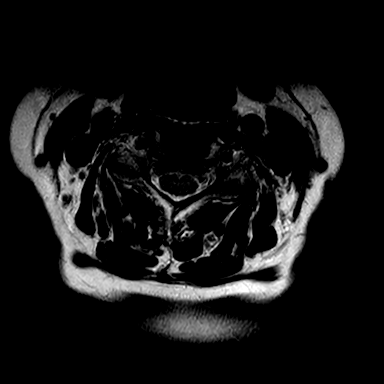
[im 21/40]
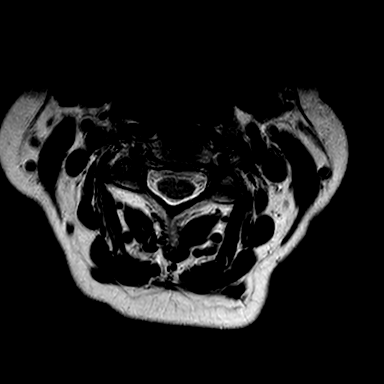
[im 23/40]
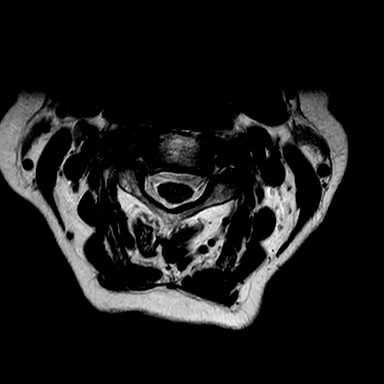
[im 34/40]
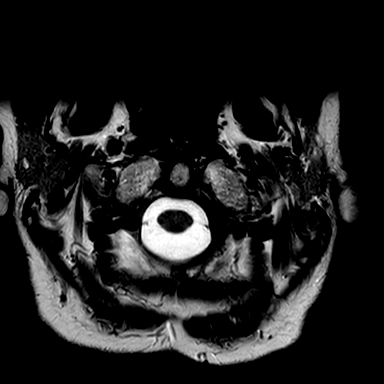

[31 of 48 positions shown; findings below may reference images not displayed]

FINDINGS: The vertebral bodies are normal in height and alignment. No vertebral 
body fracture. No spondylolisthesis. Normal bone marrow signal intensity. No 
signal abnormality or mass within the included portion of the spinal cord or 
spinal canal. The cerebellar tonsils are well located. The foramen magnum is 
negative. Included portions of the intracranial contents are negative. Posterior 
paraspinal soft tissues are negative. 
C2-C3: The disc is normal in height and signal. No disc herniation. Normal 
facets. No spinal canal or neural foraminal stenosis. 
C3-C4: The disc is normal in height and signal. No disc herniation. Mild 
degenerative change within the facet joints. No spinal canal or neural foraminal 
stenosis. 
C4-C5: The disc is normal in height and signal. No disc herniation. Broad-based 
bulging annulus. Mild/moderate degenerative change within the facet joints. 
Borderline/mild spinal stenosis.. 
C5-C6: The disc is normal in height and signal. No disc herniation. Broad-based 
bulging annulus. Moderate degenerative change within the facet joints. Mild 
spinal stenosis. No significant foraminal stenosis. 
C6-C7: The disc is normal in height and signal. No disc herniation. Broad-based 
bulging annulus. Mild/moderate degenerative change within the facet joints. Mild 
spinal but no significant foraminal stenosis.. 
C7-T1: The disc is normal in height and signal. No disc herniation. Mild 
degenerative change within the facet joints. No spinal canal or neural foraminal 
stenosis.
IMPRESSION: No significant focal protrusion of disc material. Broad-based bulging annuli 
C4-5, C5-6 and C6-7. 
Mild to moderate spondylosis with borderline to mild spinal stenosis which is 
greatest at C5-6. 
No fracture.

## 2021-03-23 IMAGING — MR MRI THORACIC SPINE WITHOUT CONTRAST
4 of 8 series · 18 of 48 positions shown · IV contrast (gadolinium)
Comparison: None.

________________________________________________________________________________________________ 
MRI THORACIC SPINE WITHOUT CONTRAST, 03/23/2021 [DATE]: 
CLINICAL INDICATION: Thoracic radiculopathy.
TECHNIQUE: Multiplanar, multiecho position MR images of the thoracic spine were 
performed without intravenous gadolinium enhancement.   Patient was scanned on a 
1.5T magnet.

[Series 101: survey · axial · 10.0mm · 1.67mm/px · z∈[-30,+199]mm · 3 of 15 slices shown]
[im 1/15]
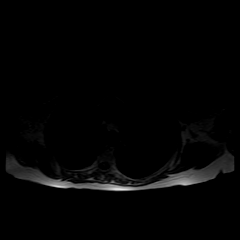
[im 8/15]
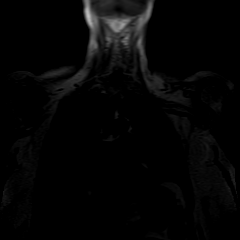
[im 15/15]
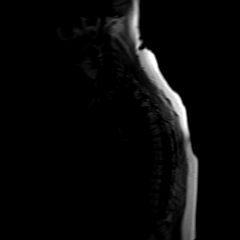

[Series 201: t2w_cor-surv · coronal · 10.0mm · 0.88mm/px · 5 of 20 slices shown]
[im 1/20]
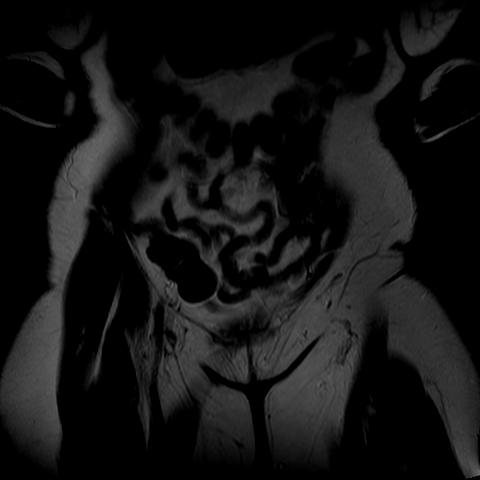
[im 5/20]
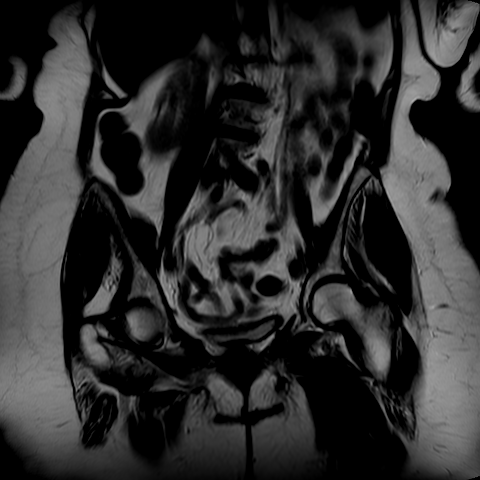
[im 10/20]
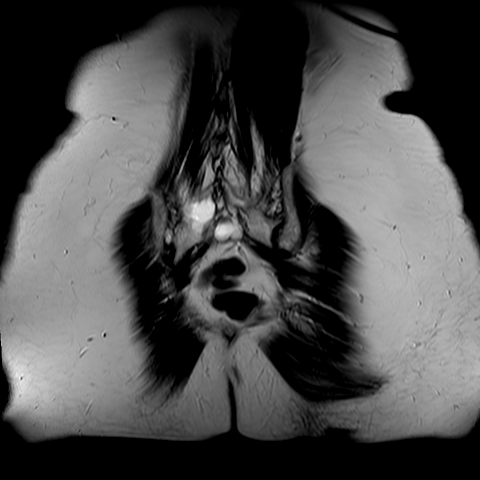
[im 15/20]
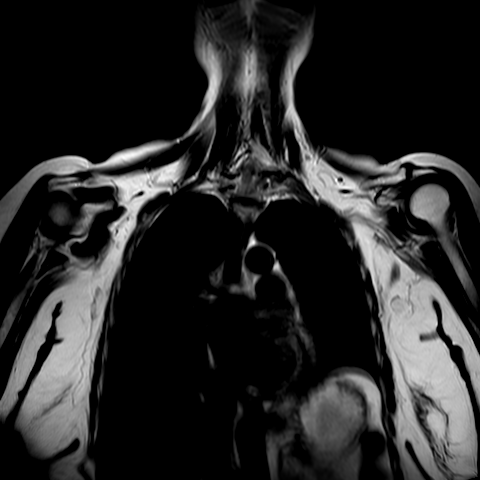
[im 20/20]
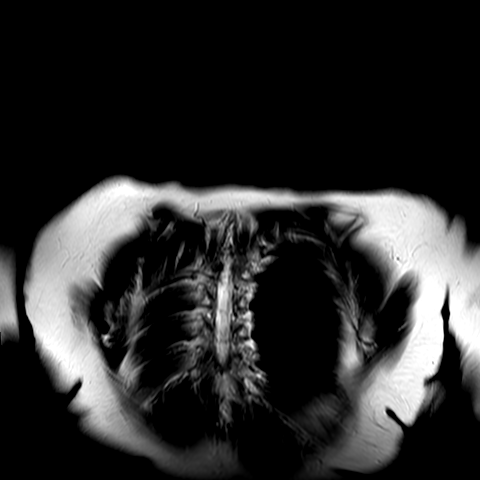

[Series 303: T1 · sagittal · 5.5mm · 0.66mm/px · 4 of 15 slices shown]
[im 1/15]
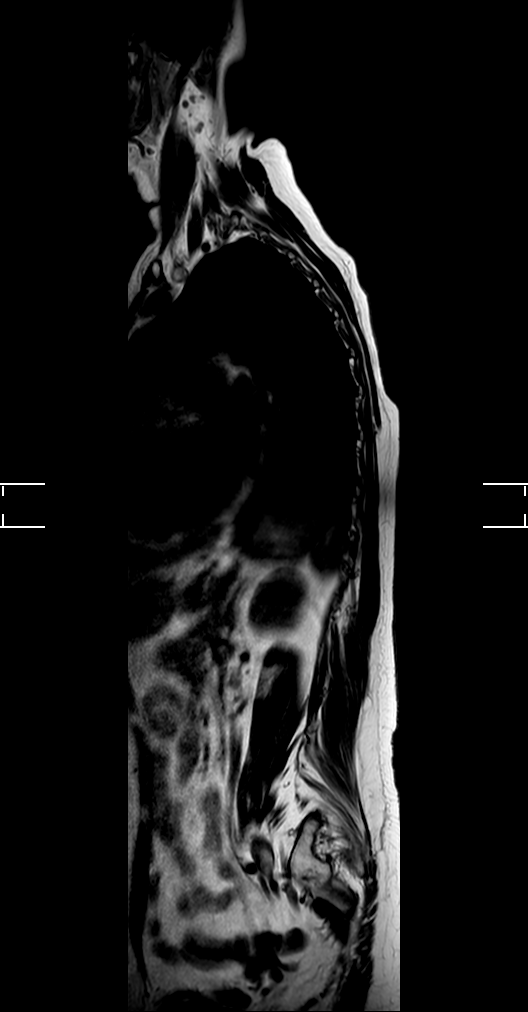
[im 5/15]
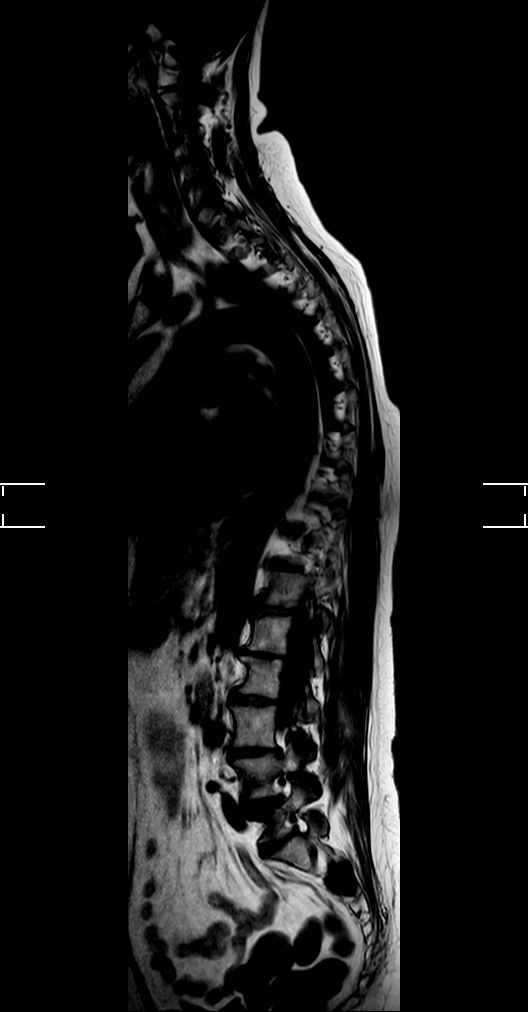
[im 10/15]
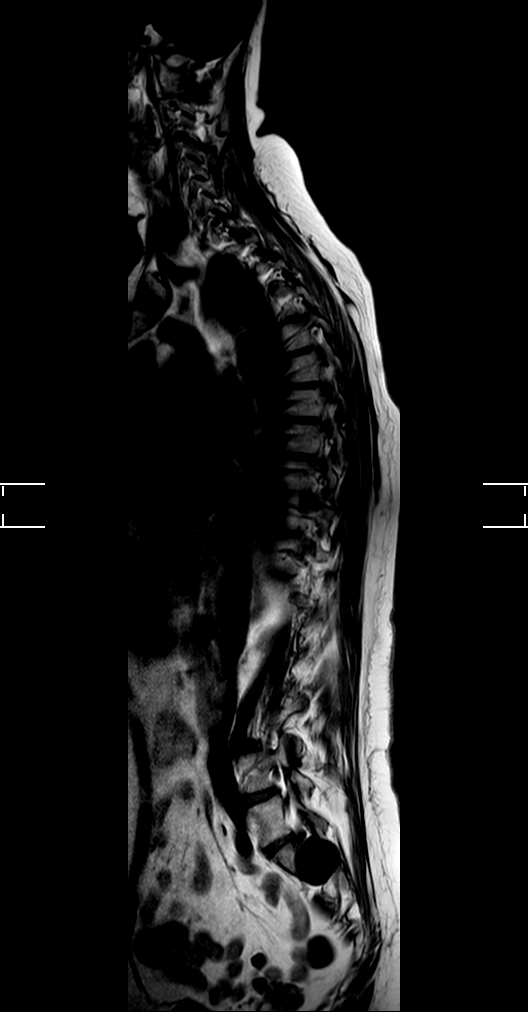
[im 15/15]
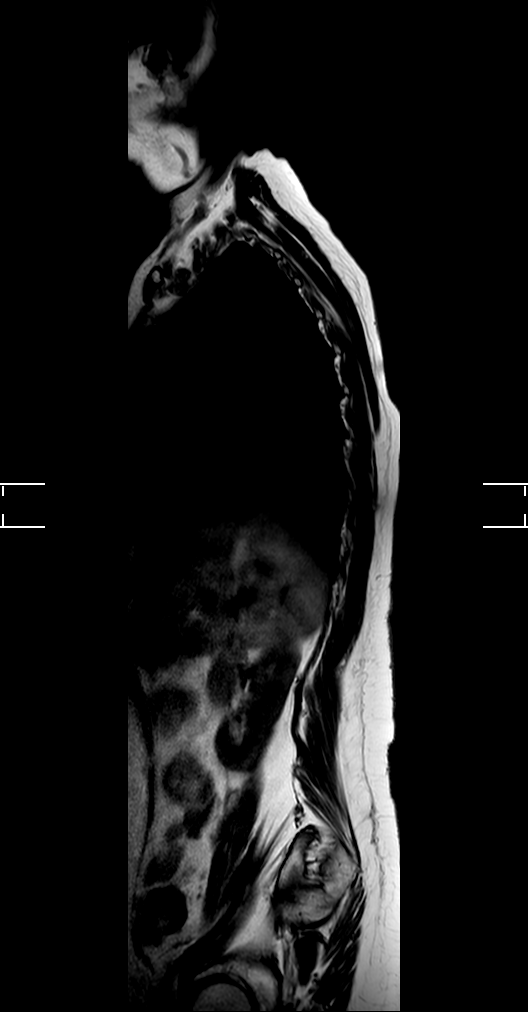

[Series 401: t1_tse_sag · sagittal · 3.0mm · 0.62mm/px · 6 of 21 slices shown]
[im 1/21]
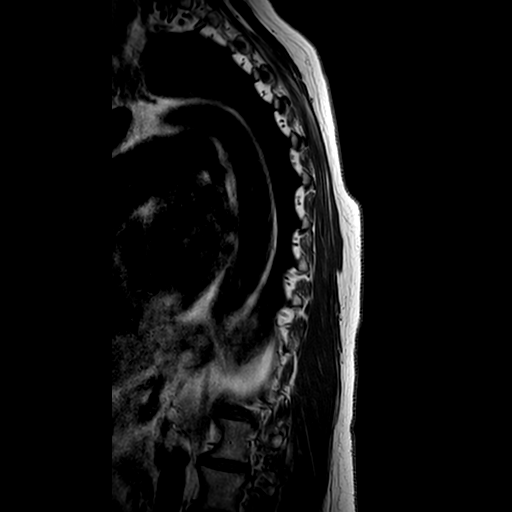
[im 5/21]
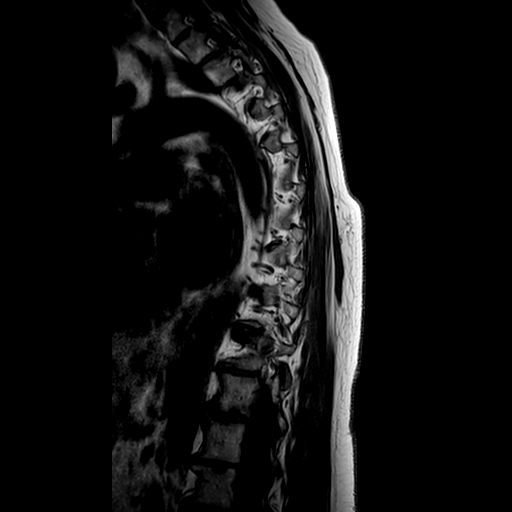
[im 9/21]
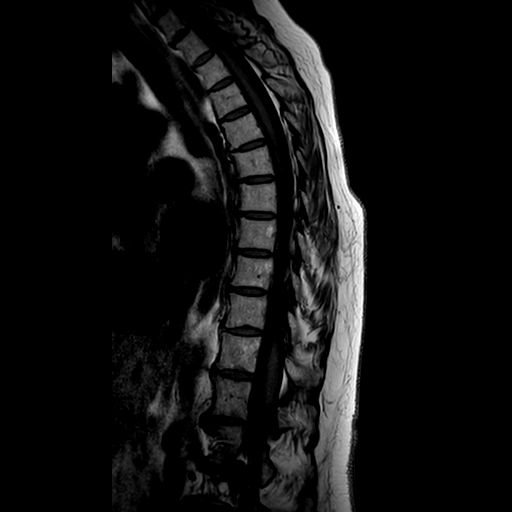
[im 13/21]
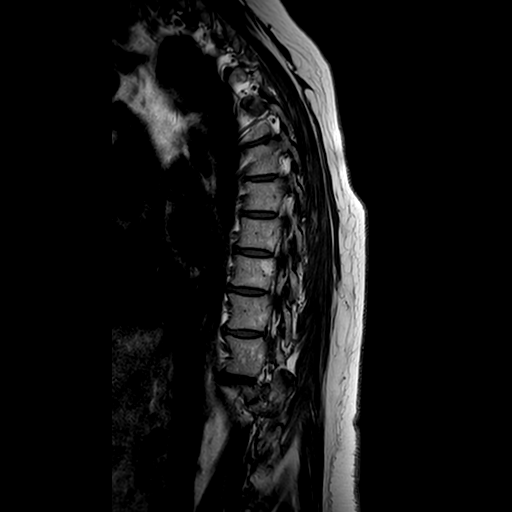
[im 17/21]
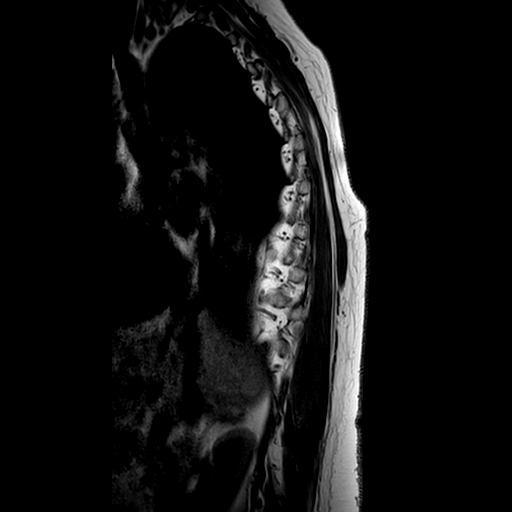
[im 21/21]
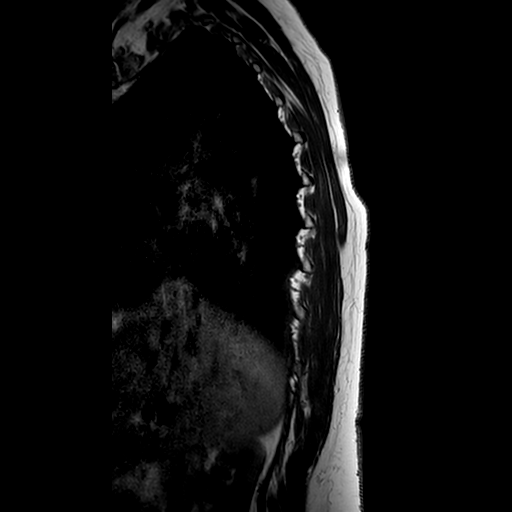

[18 of 48 positions shown; findings below may reference images not displayed]

FINDINGS: VERTEBRA: Multifocal degenerative change and mild rotatory dextroscoliosis. No 
fractures or marrow replacing lesions. T11-12 Schmorl nodes. No central canal or 
neural foraminal stenosis.  
DISCS: Multilevel degenerative disc disease. T6-7 right paracentral disc 
extrusion with superior and inferior subligamentous extension measures 2 mm in 
AP dimensions, with flattening of the ventral cord. T7-8 small central disc 
protrusion. Disc bulges at T11-12 and T12-L1.  
SPINAL CORD: Flattening of the ventral cord at the level of the T6-7 disc 
extrusion. Spinal cord is otherwise normal in caliber and signal intensity. 
Conus medullaris is at the level of L1.  
OTHER: Localizer views demonstrate multilevel degenerative change of the 
cervical and lumbar spine and rotatory lumbar levoscoliosis. 3.4 cm S1 and
cm S2 Tarlov cysts. Retroflexed uterus and 0.5 cm nabothian cyst.
IMPRESSION: 1.  Multilevel degenerative disc disease.  
2.  T6-7 right paracentral disc extrusion flattens the ventral cord.  
3.  T7-8 small central disc protrusion and disc bulges at T11-12 and T12-L1.

## 2021-10-10 IMAGING — CT CT ABDOMEN AND PELVIS WITH CONTRAST
2 of 3 series · 16 of 46 positions shown, 18 images · IV contrast (APPLIED)
Comparison: None

________________________________________________________________________________________________ 
CT ABDOMEN AND PELVIS WITH CONTRAST, 10/10/2021 [DATE]: 
A search for DICOM formatted images was conducted for prior CT imaging studies 
completed at a non-affiliated media free facility.   
CLINICAL INDICATION: Abdominal pain. History of chronic issues with irritable 
bowel syndrome and reflux disease. History of hiatal hernia. Previous cardiac 
ablation.
TECHNIQUE: The abdomen and pelvis was scanned from lung bases through the pubic 
rami with 100 mL of Isovue 300 on a high-resolution CT scanner using dose 
reduction techniques.  Routine MPR reconstructions were performed.

[Series 4: axial · axial · 0.77mm/px · z∈[-449,-47]mm · 13 of 154 slices shown, 15 images]
[im 10/154  soft-tissue]
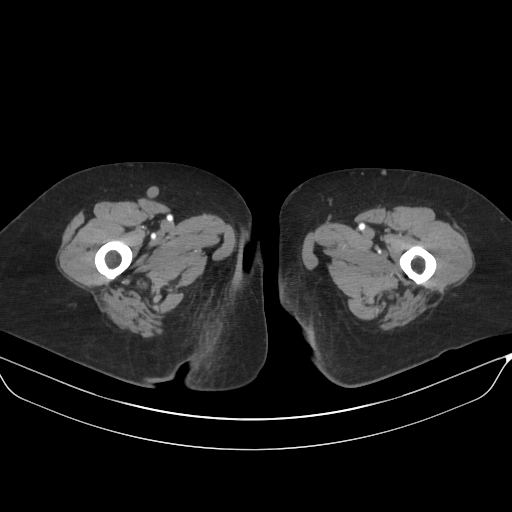
[im 10/154  bone]
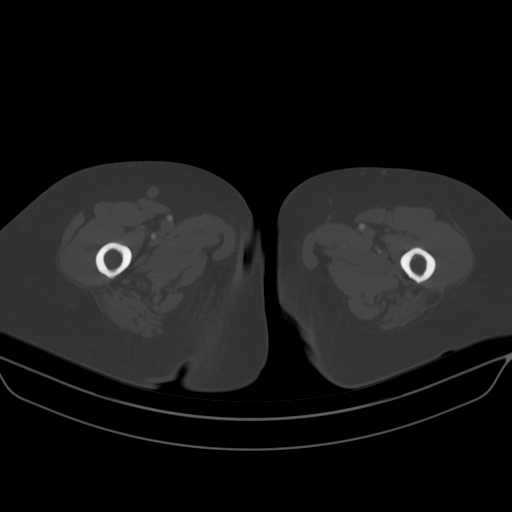
[im 20/154  soft-tissue]
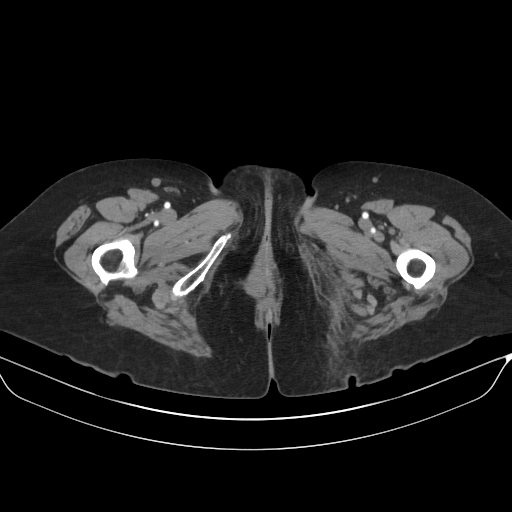
[im 30/154  soft-tissue]
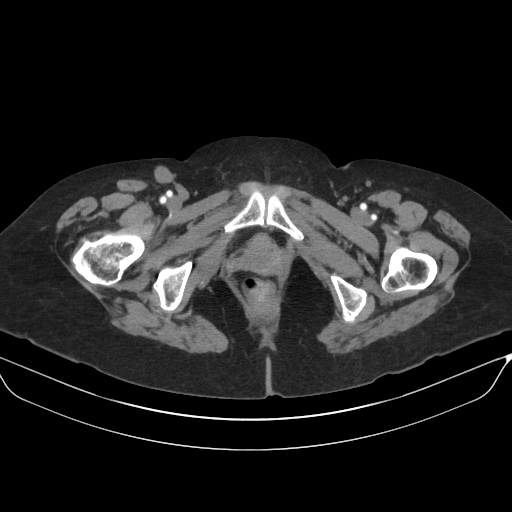
[im 45/154  soft-tissue]
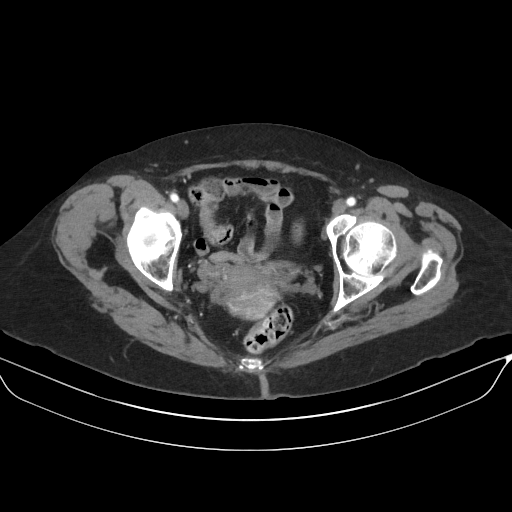
[im 55/154  soft-tissue]
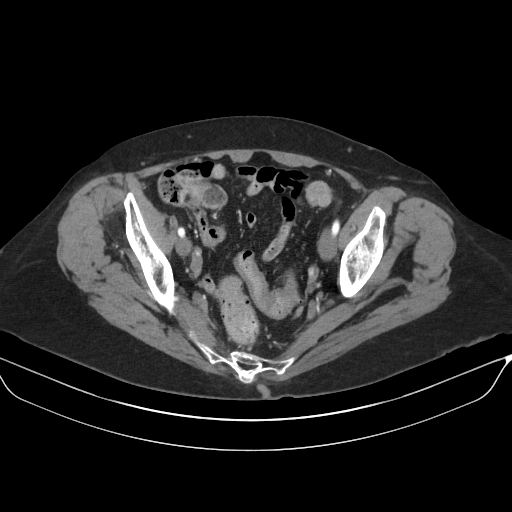
[im 65/154  soft-tissue]
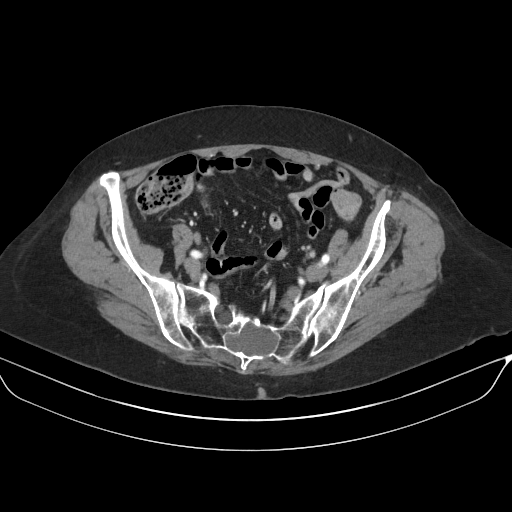
[im 79/154  soft-tissue]
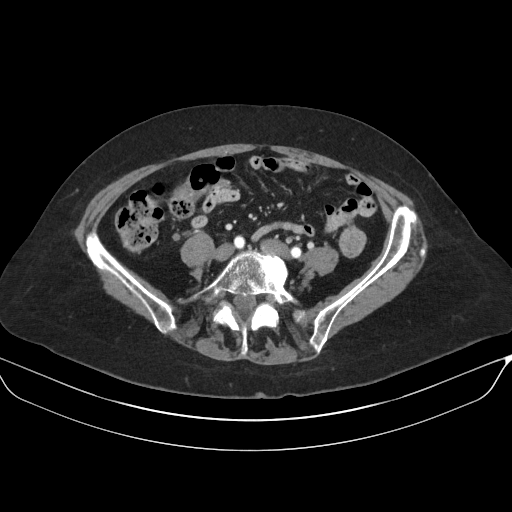
[im 89/154  soft-tissue]
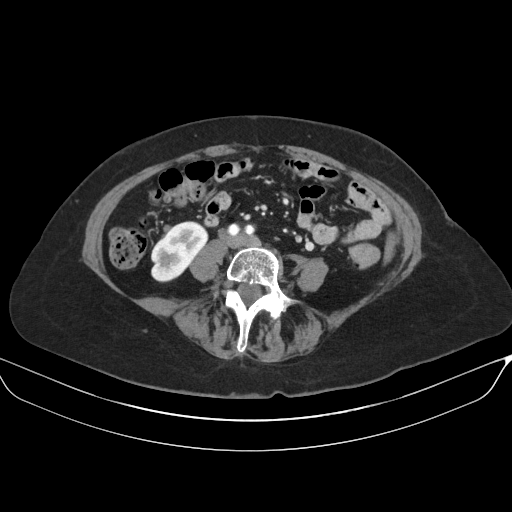
[im 99/154  soft-tissue]
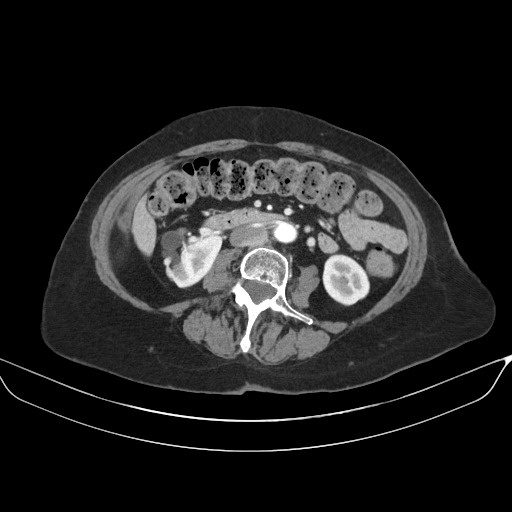
[im 99/154  bone]
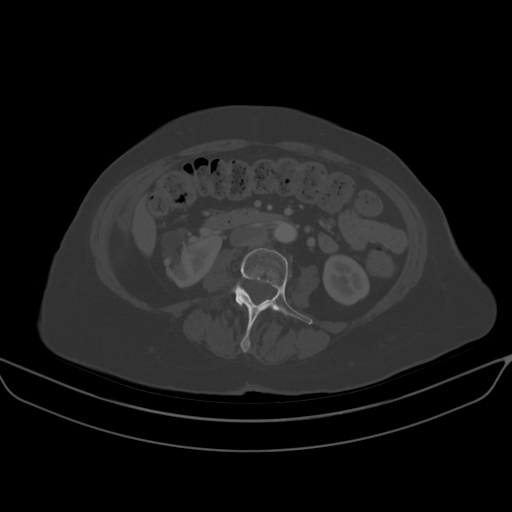
[im 109/154  soft-tissue]
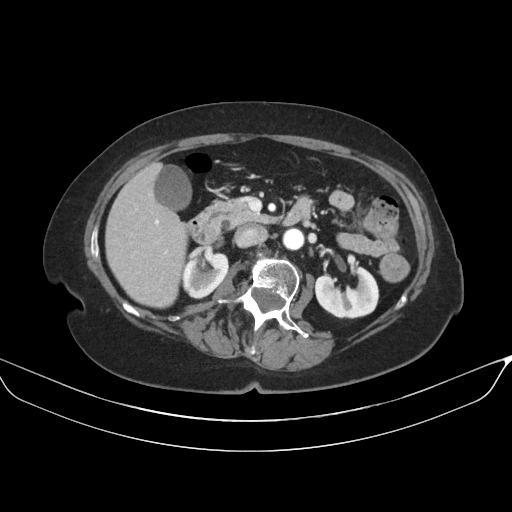
[im 124/154  soft-tissue]
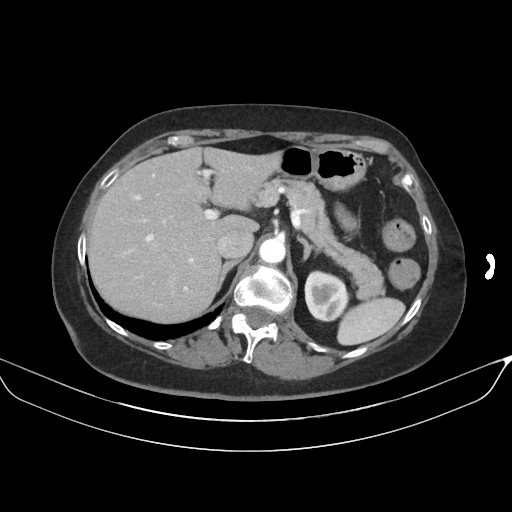
[im 134/154  soft-tissue]
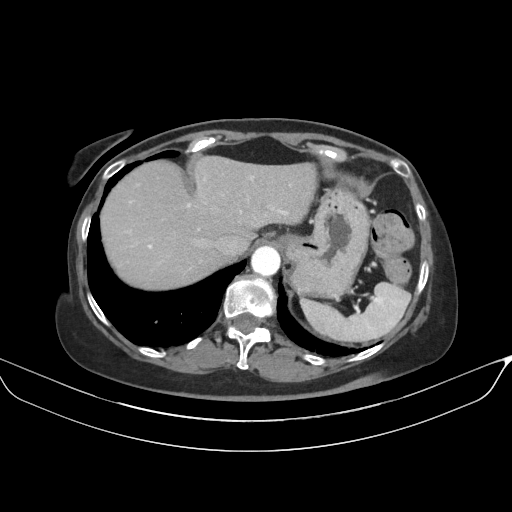
[im 144/154  soft-tissue]
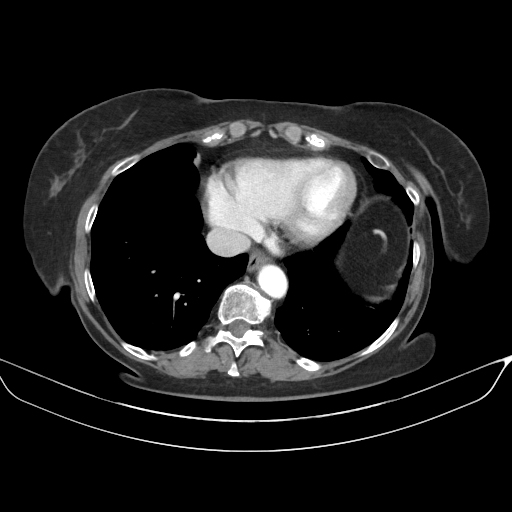

[Series 5: cor · coronal · 0.89mm/px · 3 of 110 slices shown]
[im 37/110  soft-tissue]
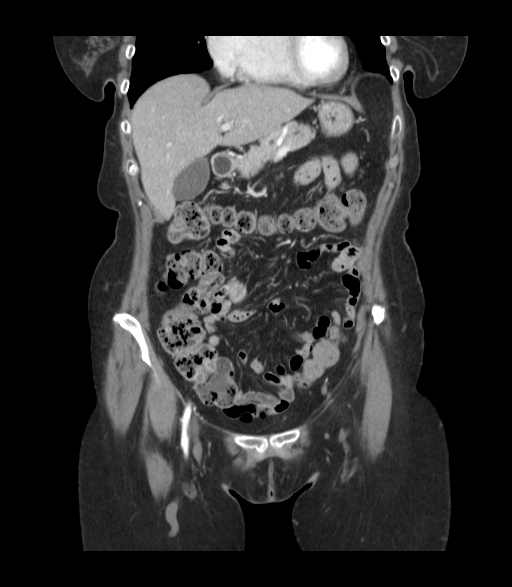
[im 49/110  soft-tissue]
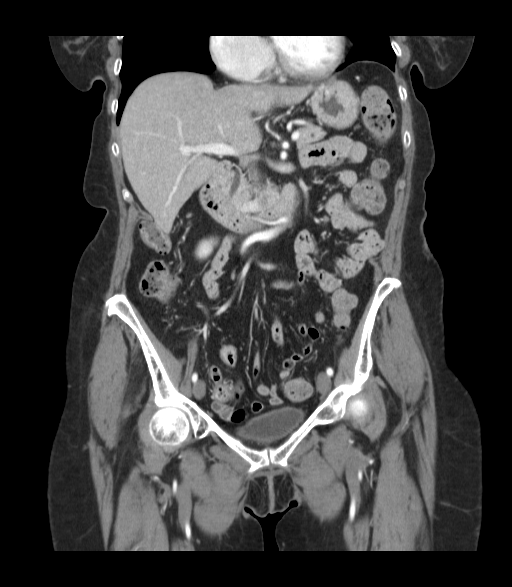
[im 61/110  soft-tissue]
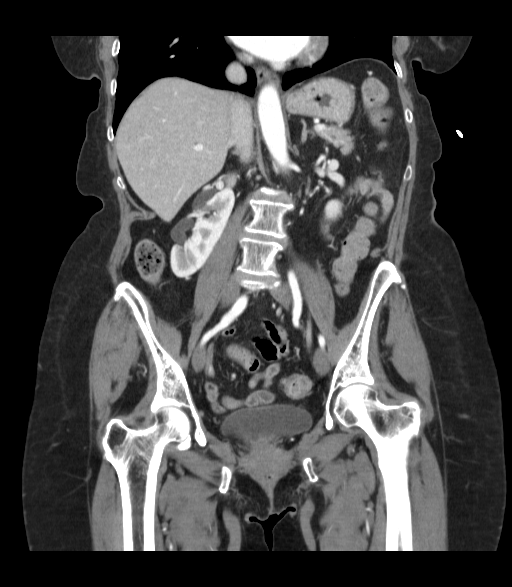

[16 of 46 positions shown; findings below may reference images not displayed]

FINDINGS: LUNG BASES: Lung bases are clear. No pleural effusions. 
HEPATOBILIARY: No mass or biliary dilatation. No gallstones. 
SPLEEN: Normal in size. 
PANCREAS: No ductal dilatation or mass.   
ADRENALS: No mass. 
GENITOURINARY: No enhancing mass or hydronephrosis.  Malrotation of the right 
kidney. Simple cyst are seen. Bladder is unremarkable. 
LYMPH NODES: No adenopathy. 
STOMACH, SMALL BOWEL AND COLON: Mild thickening of the mucosa within the sigmoid 
colon. Significance uncertain. No obstructive changes or acute inflammatory 
changes seen. 
VASCULAR STRUCTURES: Venous varicosities seen overlying both lower extremities 
incompletely evaluated.  
MUSCULOSKELETAL: Degenerative changes. Mild scoliosis. Benign cystic changes 
seen within the sacrum.  
ADDITIONAL FINDINGS: Fibroid uterus.
IMPRESSION: Mild thickening of mucosa in the sigmoid colon. Significance uncertain. 
Otherwise unremarkable exam. Please see discussion above. 
RADIATION DOSE REDUCTION: All CT scans are performed using radiation dose 
reduction techniques, when applicable.  Technical factors are evaluated and 
adjusted to ensure appropriate moderation of exposure.  Automated dose 
management technology is applied to adjust the radiation doses to minimize 
exposure while achieving diagnostic quality images.

## 2022-05-13 IMAGING — MR MRI LUMBAR SPINE WITHOUT CONTRAST
4 of 7 series · 16 of 48 positions shown · IV contrast (gadolinium)
Comparison: Abdominal pelvic CT October 10, 2021. No prior lumbar examination

________________________________________________________________________________________________ 
MRI LUMBAR SPINE WITHOUT CONTRAST, 05/13/2022 [DATE]: 
CLINICAL INDICATION: Spondylosis without myelopathy, chronic low back pain with 
bilateral leg tingling
TECHNIQUE: Sagittal T1, Sagittal T2, Sagittal STIR, Axial T1 and Axial T2 MR 
images of the lumbar spine were performed without intravenous gadolinium 
enhancement.

[Series 101: survey · axial · 10.0mm · 1.39mm/px · z∈[-15,+199]mm · 3 of 9 slices shown]
[im 1/9]
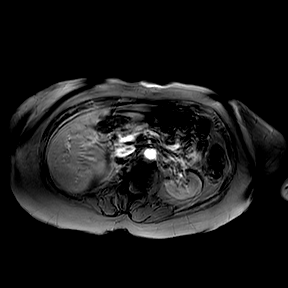
[im 5/9]
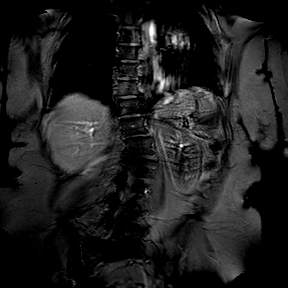
[im 9/9]
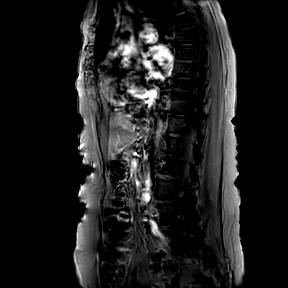

[Series 201: t2w_cor-surv · coronal · 6.0mm · 0.50mm/px · 2 of 5 slices shown]
[im 1/5]
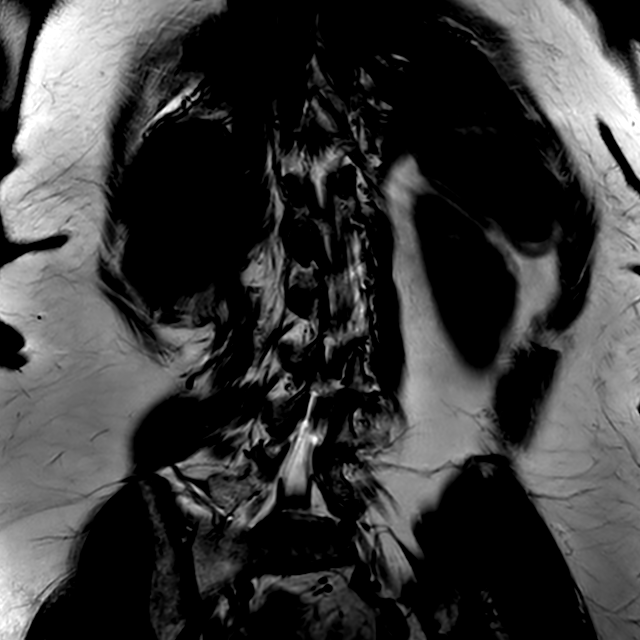
[im 5/5]
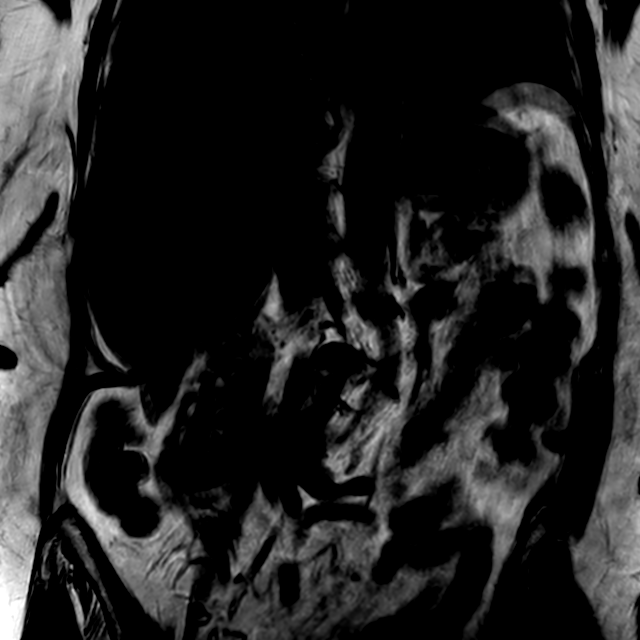

[Series 301: t1w_tse sag · sagittal · 4.0mm · 0.39mm/px · 7 of 19 slices shown]
[im 1/19]
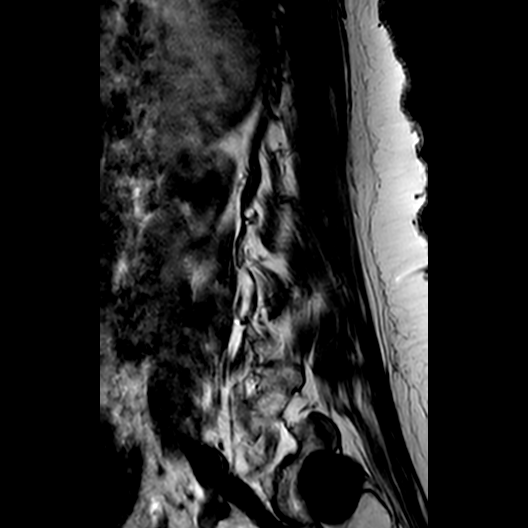
[im 4/19]
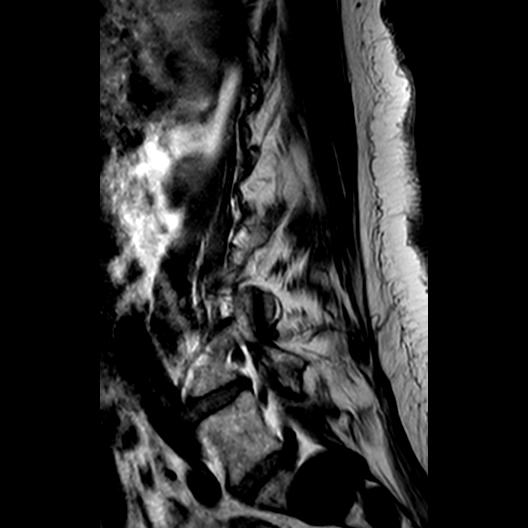
[im 7/19]
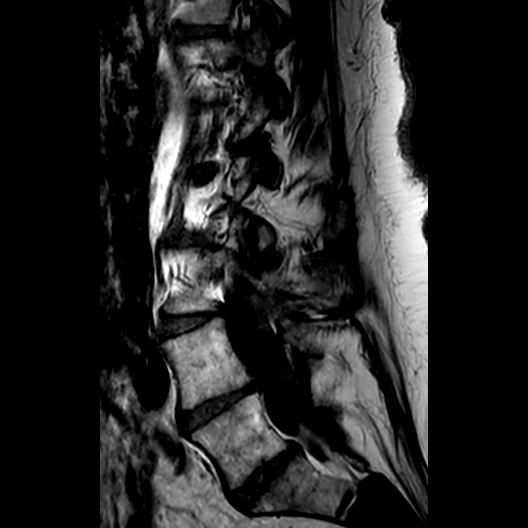
[im 10/19]
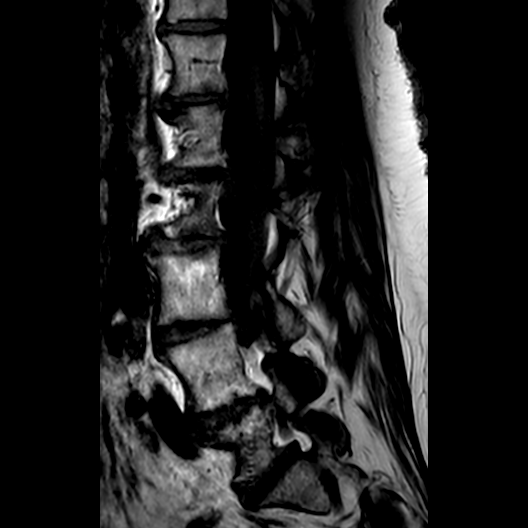
[im 13/19]
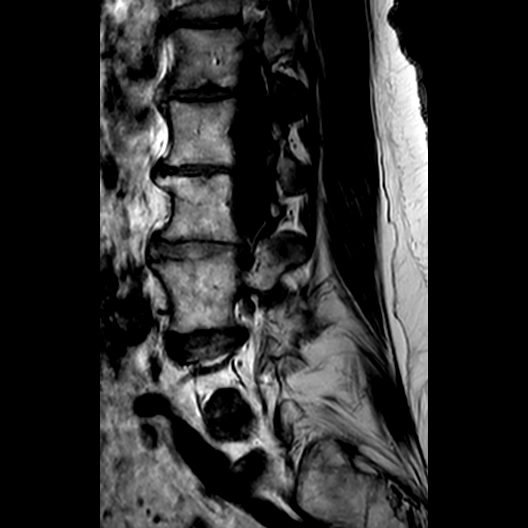
[im 16/19]
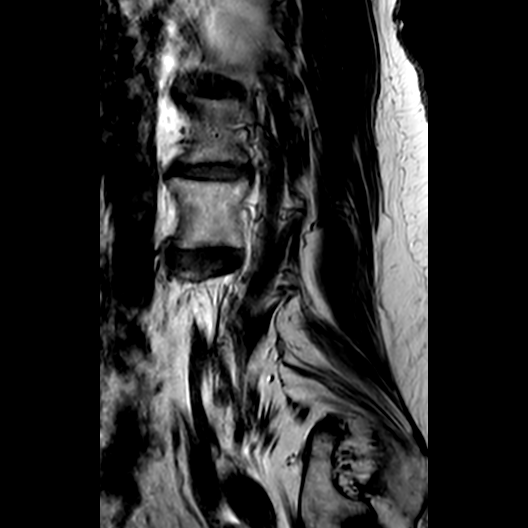
[im 19/19]
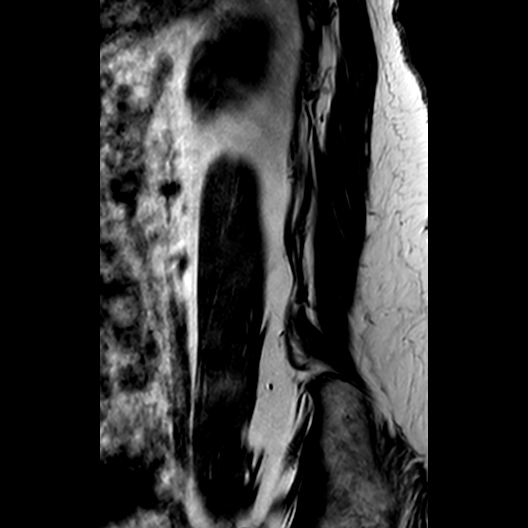

[Series 402: sst2 sag/fs · sagittal · 4.0mm · 0.29mm/px · 4 of 19 slices shown]
[im 1/19]
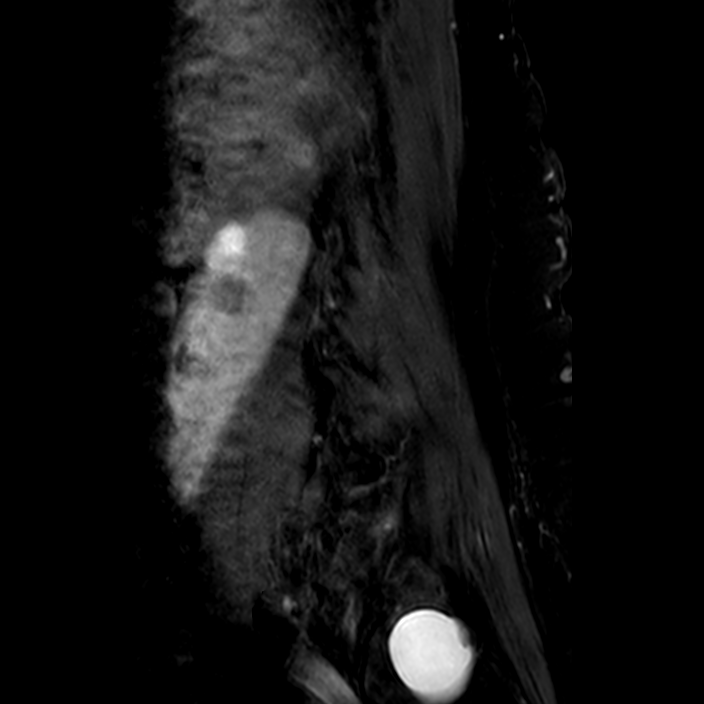
[im 4/19]
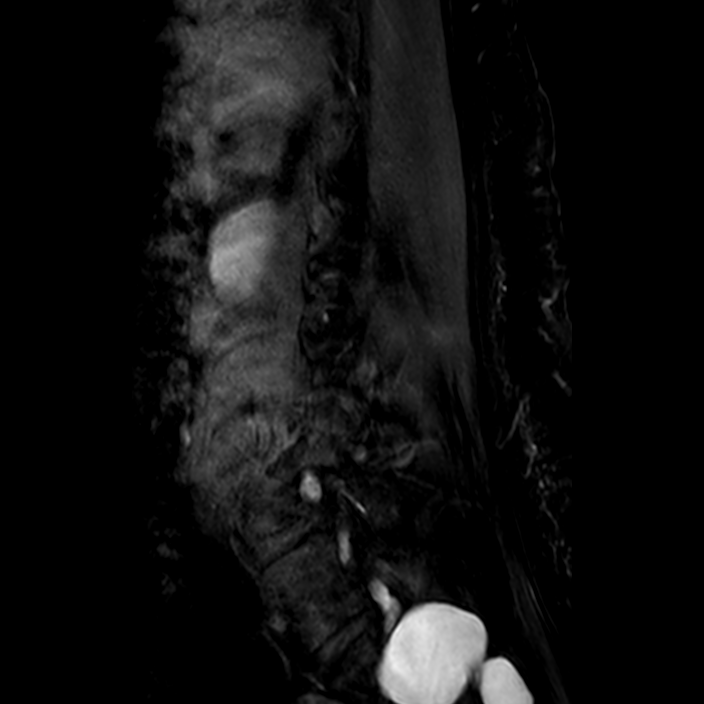
[im 10/19]
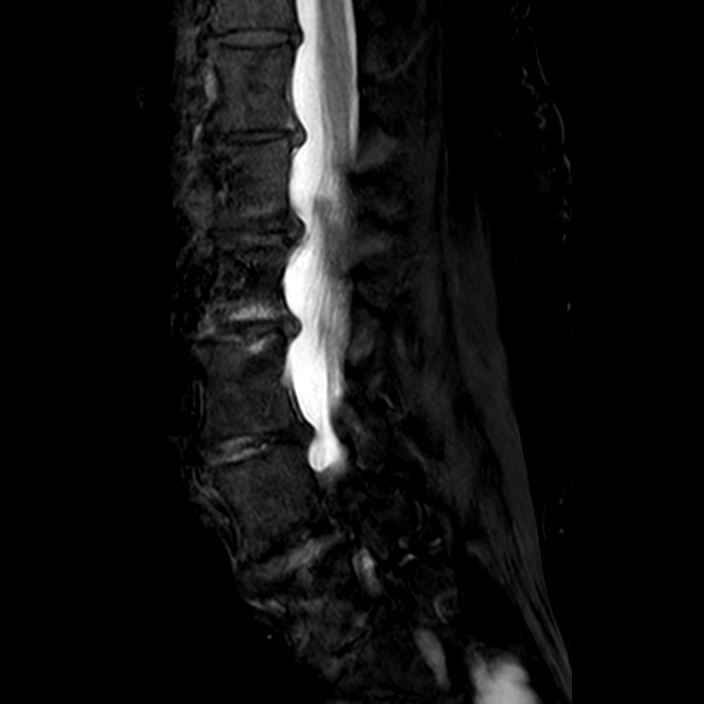
[im 16/19]
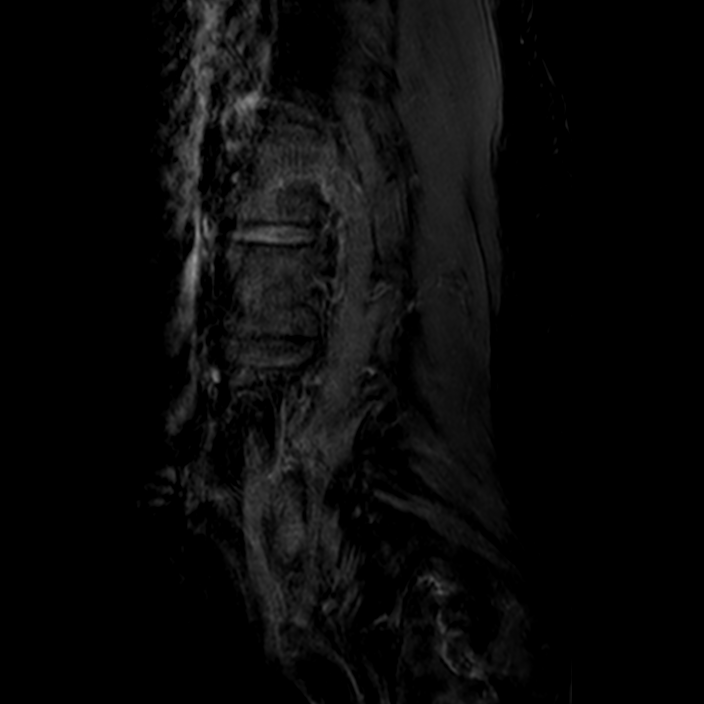

[16 of 48 positions shown; findings below may reference images not displayed]

FINDINGS: Todays study is mildly limited by motion artifact. Lumbar vertebral 
heights are intact. There are large sacral Tarlov cyst with bony remodeling and 
erosion. There is moderate thoracolumbar levoscoliosis. No evidence for spinal 
malignancy. 
At L5-S1 the canal is open. There is no significant foraminal stenosis. 
At L4-5 there is a mild left paracentral disc bulge without significant canal or 
foraminal stenosis. 
At L3-4 there is mild disc bulge without significant stenosis. 
At L2-3 there is mild right-sided canal stenosis due to right posterolateral 
disc bulge, axial T2 image 18. Mild right foraminal stenosis, left foramen open. 
At L1-2 there is mild right-sided canal stenosis due to eccentric disc bulge and 
endplate osteophyte, axial T2 image 23. Mild to moderate right foraminal 
stenosis. 
At T12-L1 the canal and foramina are open.
IMPRESSION: There is moderate thoracolumbar levoscoliosis. There is mild right-sided canal 
stenosis at L1-2 and L2-3 along the inner aspect of the scoliotic curve. 
No evidence for lumbar fracture or spinal malignancy. Mild Modic type I change 
on the right at L2-3. 
Moderate facet degenerative changes throughout the lumbar spine. 
Large sacral Tarlov cysts are not completely evaluated. These produce bony 
erosion. These are chronic findings, appearing similar to the October 10, 2021 
abdominal/pelvic CT. If clinically indicated MR of the sacrum would be useful 
for better evaluation.

## 2022-08-09 IMAGING — CT CTA HEAD AND NECK
3 of 10 series · 13 of 47 positions shown · IV contrast (APPLIED)
Comparison: 

________________________________________________________________________________________________ 
******** ADDENDUM #1 ********/n
TECHNIQUE: The head and neck were scanned from the skull vertex through the AP 
window with 75 mL of  Isovue 370 injected intravenously on a high resolution low 
dose CT scanner using dose reduction techniques. 25 mL of Isovue 370 were 
discarded. Routine MPR and MIP 3D renderings were reconstructed on an 
independent workstation with concurrent physician supervision. Count of known CT 
and Cardiac Nuclear Medicine studies performed in the previous 12 months =  
CTA HEAD AND NECK, 08/09/2022 [DATE]: 
CLINICAL INDICATION: Syncope. 
A search for DICOM formatted images was conducted for prior CT imaging studies 
completed at a non-affiliated media free facility.
dose CT scanner using dose reduction techniques. 0 mL of Isovue 370 were 
and Cardiac Nuclear Medicine studies performed in the previous 12 months = .

[Series 6: (person_name) · axial · 0.41mm/px · z∈[-311,-19]mm · 9 of 520 slices shown]
[im 52/520  brain]
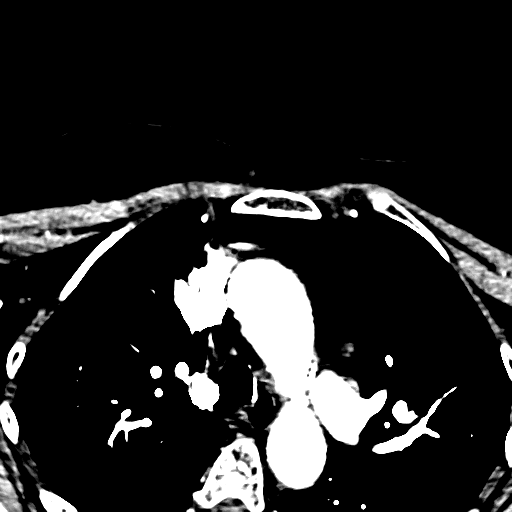
[im 104/520  bone]
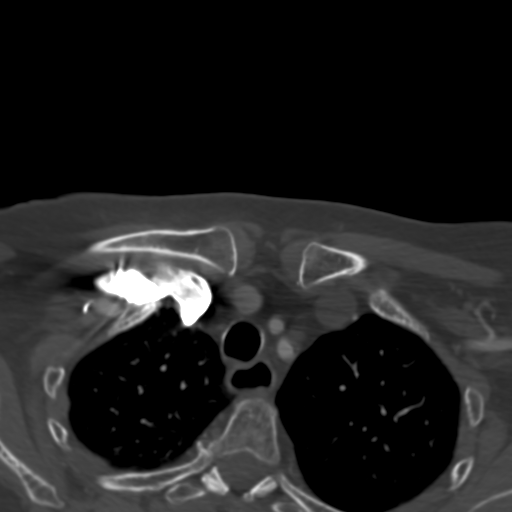
[im 156/520  brain]
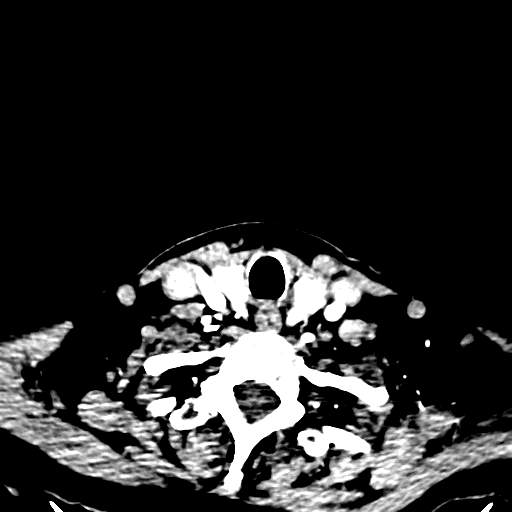
[im 208/520  bone]
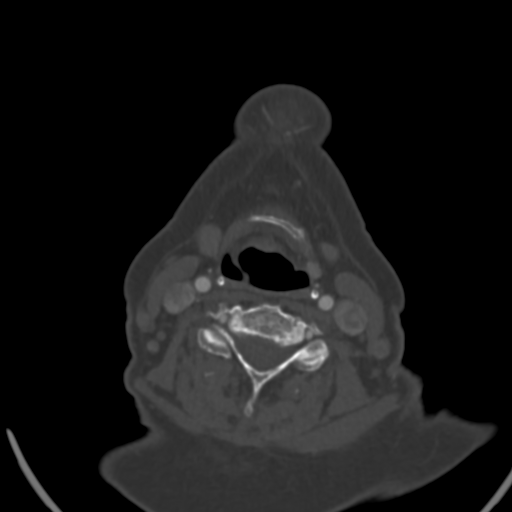
[im 260/520  brain]
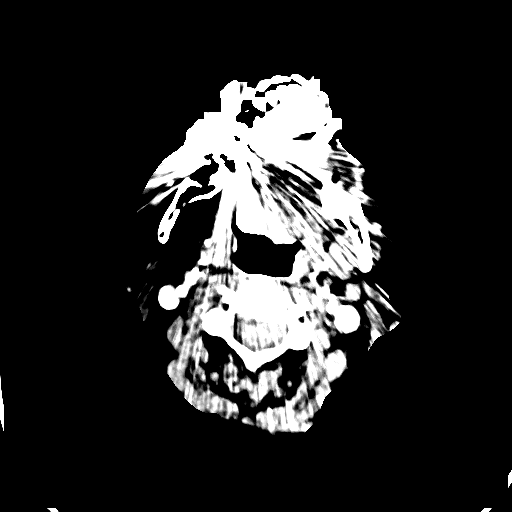
[im 312/520  bone]
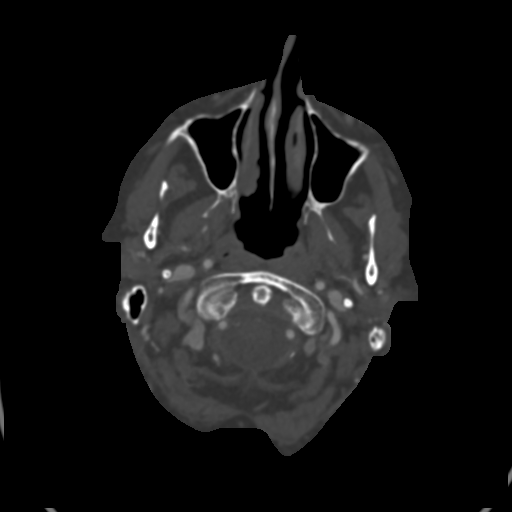
[im 364/520  brain]
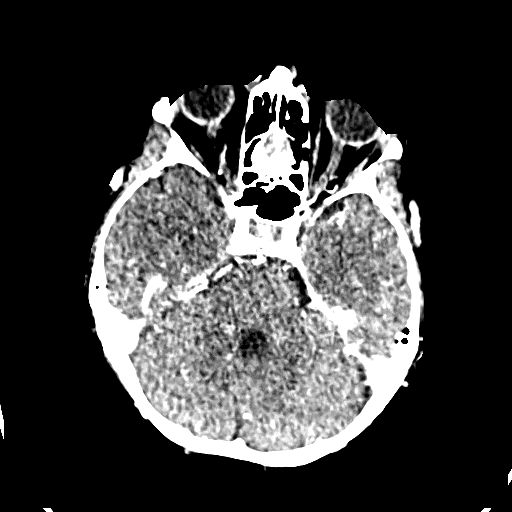
[im 416/520  bone]
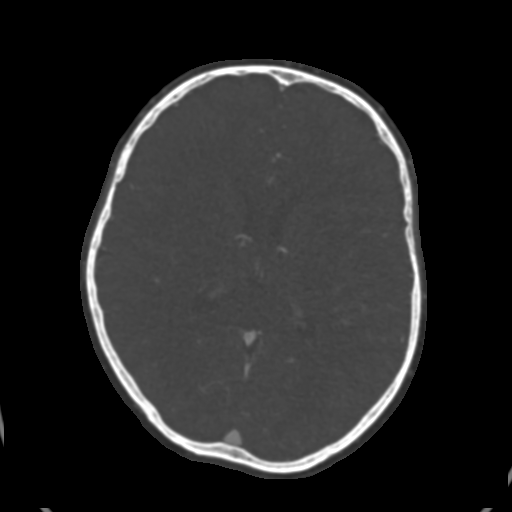
[im 468/520  brain]
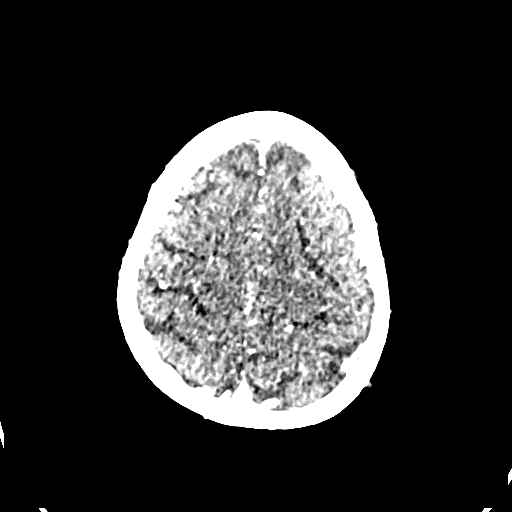

[Series 7: coronal · coronal · 0.44mm/px · 2 of 102 slices shown]
[im 34/102  brain]
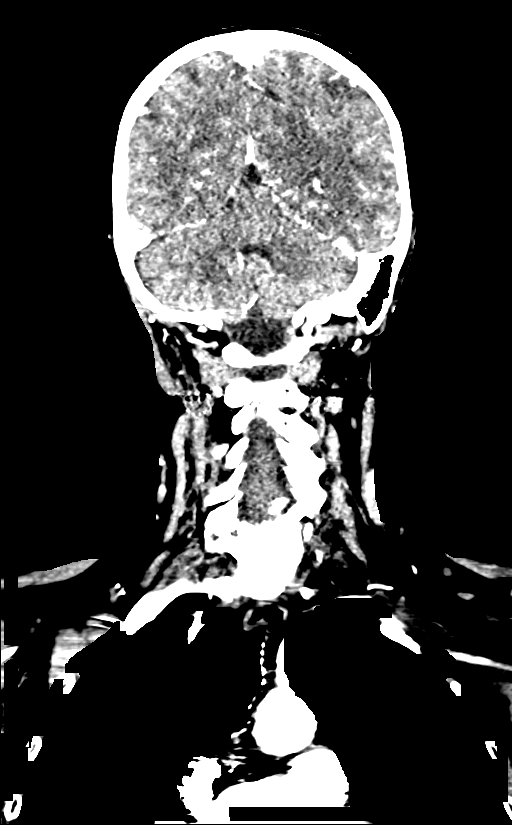
[im 68/102  brain]
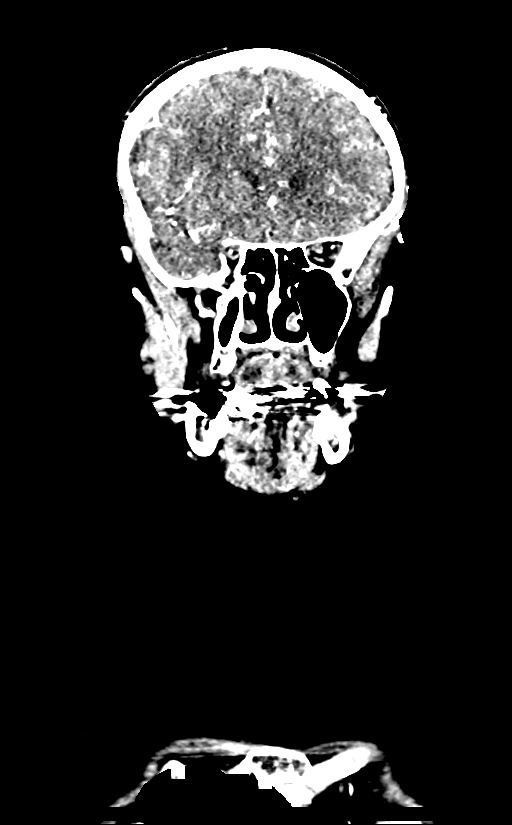

[Series 8: sagittal · sagittal · 0.42mm/px · 2 of 104 slices shown]
[im 35/104  brain]
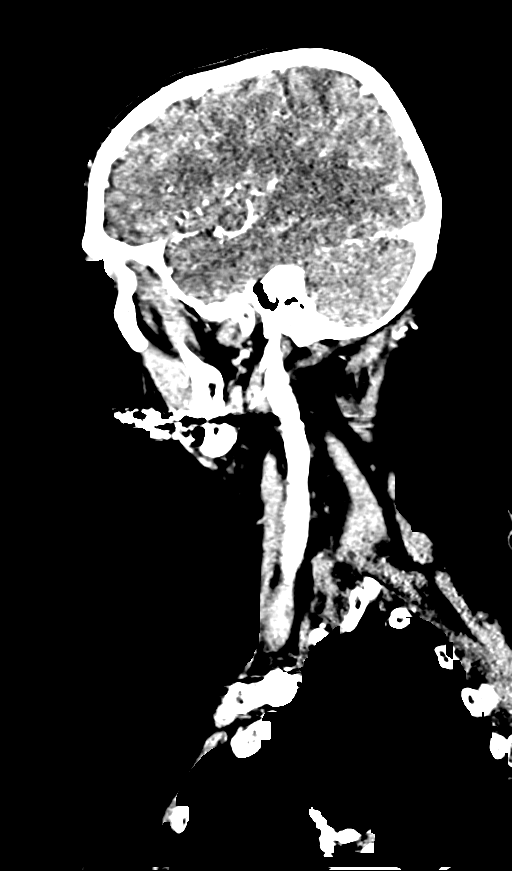
[im 69/104  brain]
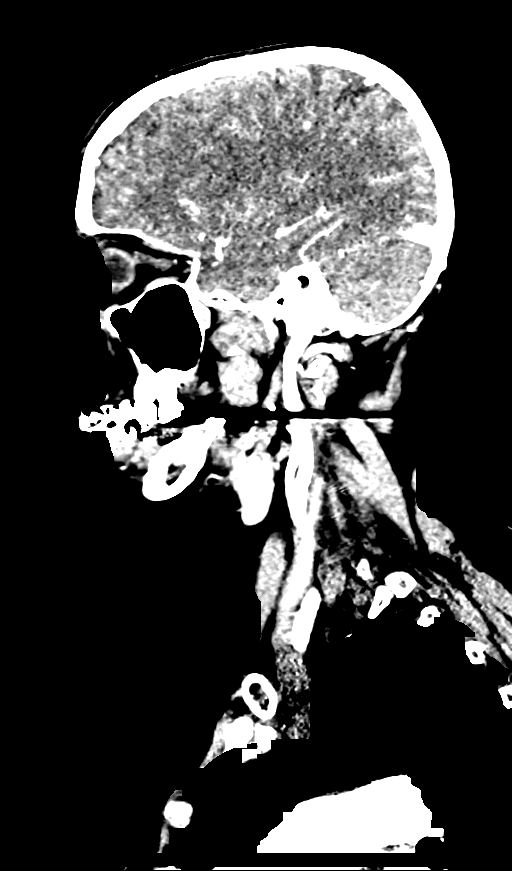

[13 of 47 positions shown; findings below may reference images not displayed]

FINDINGS: -------------------------------------------------------------------------------- 
------     
HEAD VASCULAR: 
ANTERIOR CIRCULATION: No stenosis/occlusion, aneurysm, or high flow vascular 
malformation. Standard circle of Wills anatomy. 
POSTERIOR CIRCULATION: No stenosis/occlusion, aneurysm, or high flow vascular 
malformation. Balanced vertebral arteries supply a normal basilar artery. 
DURAL VENOUS SINUSES: Expected enhancement of the major dural venous sinuses. 

-------------------------------------------------------------------------------- 
------ 
NECK VASCULAR: 
RIGHT CAROTID: Mild atherosclerotic calcification at the origin of the right 
internal carotid artery resulting in no significant stenosis by NASCET criteria. 
LEFT CAROTID: Mild atherosclerotic calcification at the origin of the left 
internal carotid artery resulting in no significant stenosis by NASCET criteria. 
VERTEBRAL ARTERIES: No focal stenosis or dissection. Balanced vertebral 
arteries. 
AORTIC ARCH: Classic aortic arch anatomy with no significant stenosis at the 
origin of the great vessels. 
-------------------------------------------------------------------------------- 
------ 
NONVASCULAR STRUCTURES:  
No acute intracranial hemorrhage, mass effect, or midline shift. No CT evidence 
of large acute territorial ischemia. No hydrocephalus. Cerebral volume is age 
appropriate. 
Visualized orbits are unremarkable. Visualized paranasal sinuses and mastoid air 
cells are clear. Soft tissues of the neck are unremarkable. 
Osseous structures are intact noting scattered degenerative changes. 
Lung apices are clear. Distention esophagus. 
-------------------------------------------------------------------------------- 
------
IMPRESSION: 1. No significant focal stenosis within the cervical distribution of either 
carotid or vertebral artery.. 
2. No aneurysms or vascular malformations. 

RADIATION DOSE REDUCTION: All CT scans are performed using radiation dose 
reduction techniques, when applicable.  Technical factors are evaluated and 
adjusted to ensure appropriate moderation of exposure.  Automated dose 
management technology is applied to adjust the radiation doses to minimize 
exposure while achieving diagnostic quality images.

## 2023-08-10 IMAGING — MR MRI LUMBAR SPINE WITHOUT CONTRAST
5 of 9 series · 8 of 48 positions shown · IV contrast (gadolinium)
Comparison: MRI lumbar spine May 13, 2022. MRI thoracic spine March 23, 2021.

________________________________________________________________________________________________ 
MRI LUMBAR SPINE WITHOUT CONTRAST, 08/10/2023 [DATE]: 
CLINICAL INDICATION: Radiculopathy, lumbar region , chronic low back pain with 
radiation down the right leg to the knee.
TECHNIQUE: Multiplanar, multiecho position MR images of the lumbar spine were 
performed without intravenous gadolinium enhancement. Patient was scanned on a 
3T magnet

[Series 101: survey · axial · 10.0mm · 1.39mm/px · 1 of 9 slices shown (1 of 2)]
[im 1/9]
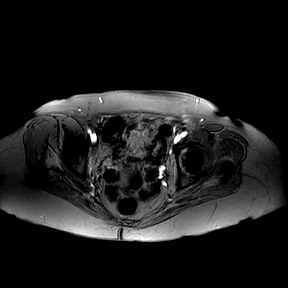

[Series 201: survey · axial · 10.0mm · 1.39mm/px · 1 of 9 slices shown (2 of 2)]
[im 1/9]
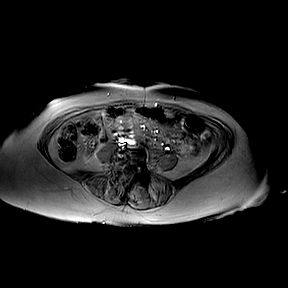

[Series 301: t2w_cor-surv · coronal · 6.0mm · 0.50mm/px · 1 of 5 slices shown]
[im 1/5]
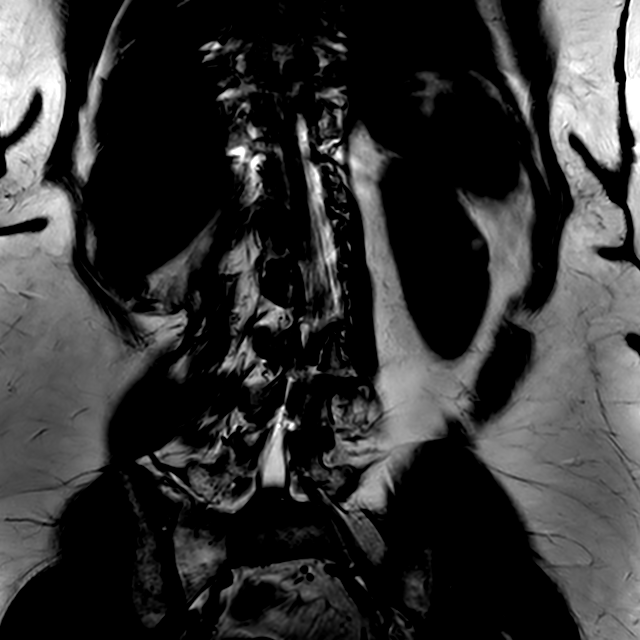

[Series 401: t1w_tse sag · sagittal · 4.0mm · 0.23mm/px · 3 of 17 slices shown]
[im 1/17]
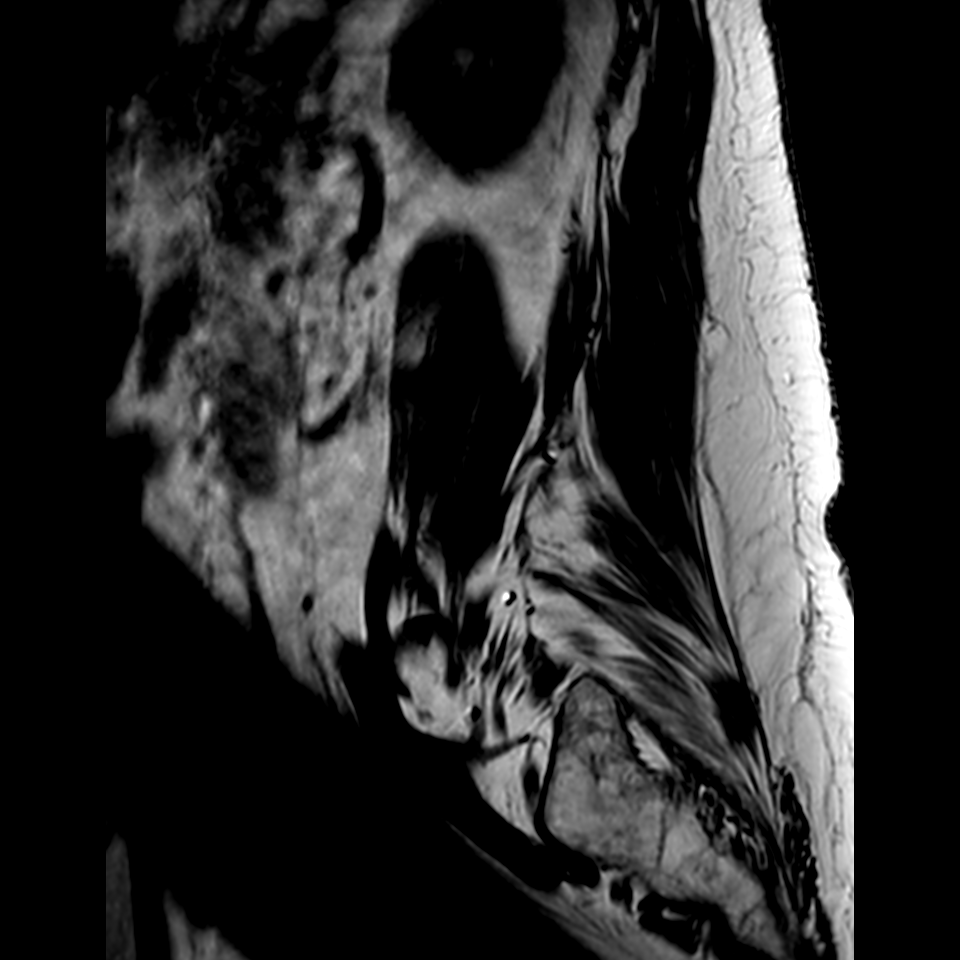
[im 9/17]
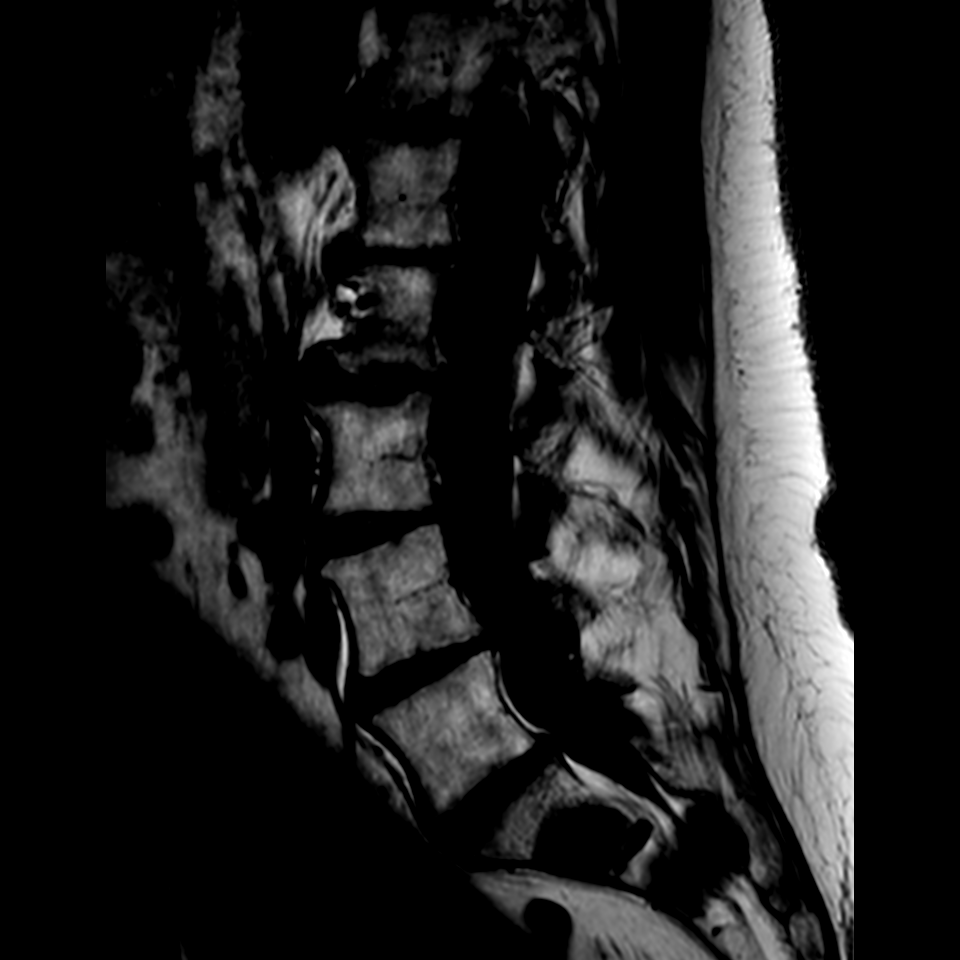
[im 17/17]
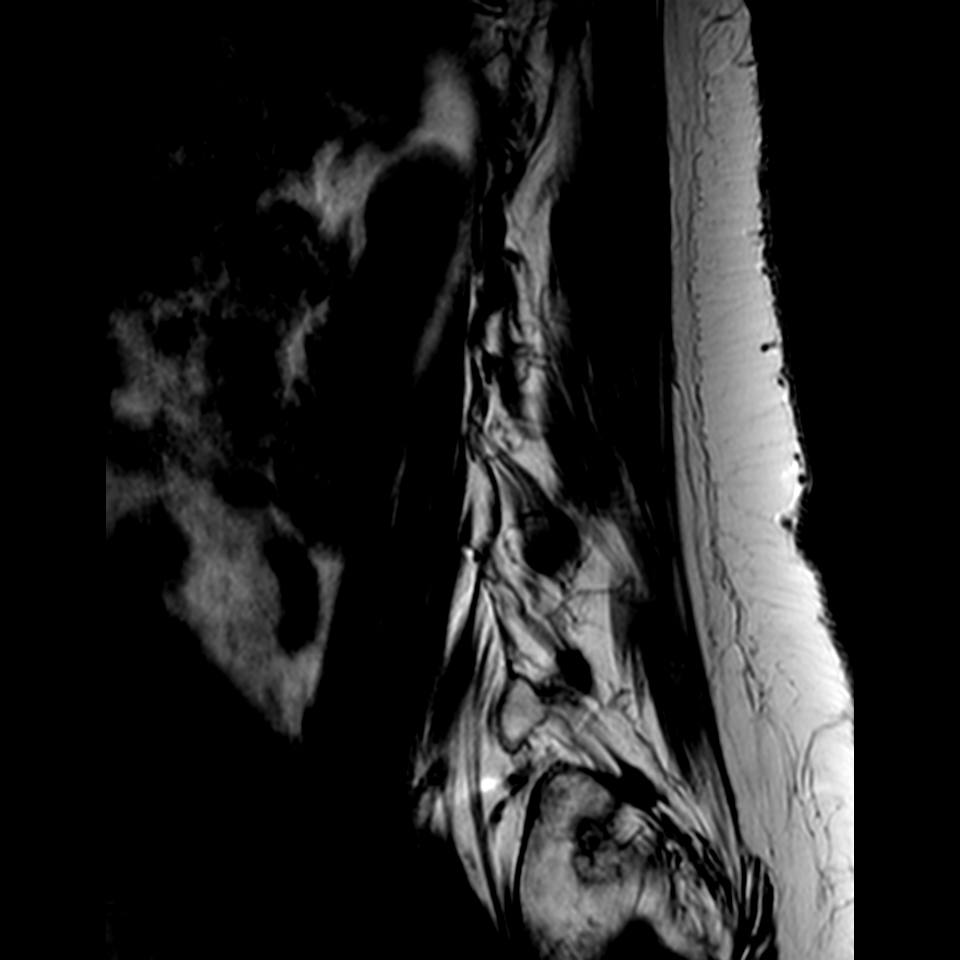

[Series 501: t2w_tse sag · sagittal · 4.0mm · 0.25mm/px · 2 of 17 slices shown]
[im 1/17]
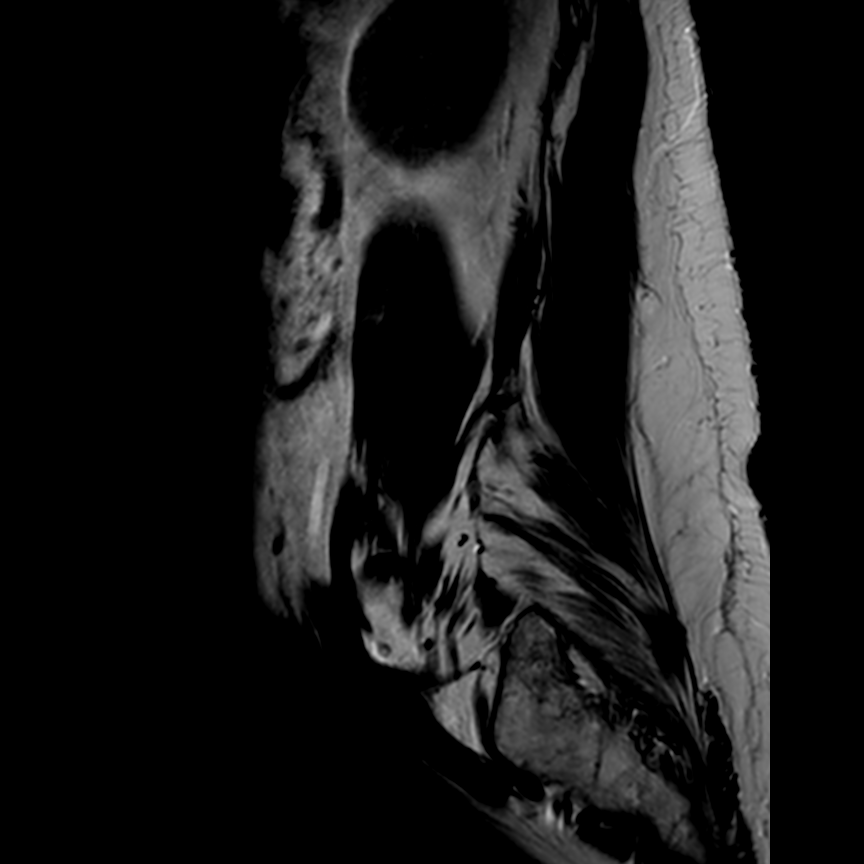
[im 9/17]
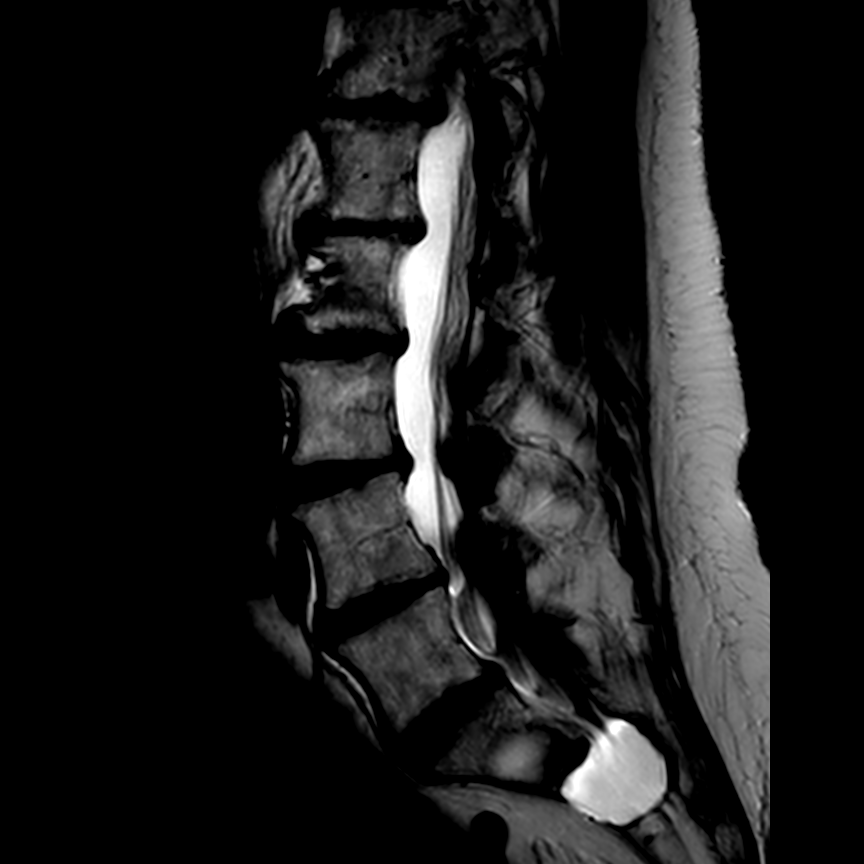

[8 of 48 positions shown; findings below may reference images not displayed]

FINDINGS: -------------------------------------------------------------------------------- 
------ 
GENERAL: 
Nomenclature is based on 5 lumbar type vertebral bodies.     
ALIGNMENT: Mild to moderate levoconvex thoracic lumbar scoliosis. Grade 1 
retrolisthesis L1 on L2. Otherwise there is anatomic sagittal alignment. 
VERTEBRAL BODY HEIGHT: Normal.  
MARROW SIGNAL: No focal suspect signal abnormality. 
CORD SIGNAL: Normal distal spinal cord and cauda equina. Conus medullaris 
terminates at L1. 
ADDITIONAL FINDINGS: Large sacral Tarlov cysts.. Ovoid 10 mm lesion in the 
lateral right kidney is suboptimally visualized. There is a subcentimeter left 
renal cyst. 
Modic I-II: None. 
Ligamentum Flavum > 2.5 mm: All levels. 
-------------------------------------------------------------------------------- 
------ 
SEGMENTAL: 
T12-L1: Loss of disc height and signal. Schmorls nodes. Posterior osteophytic 
ridging. Mild canal stenosis from annular bulge. Facet arthropathy. Mild left 
and moderate right foraminal narrowing. Stable. 
L1-L2: Loss of disc height and signal. Mild right lateral recess narrowing. Mild 
diffuse annular bulge. Moderate to severe right foraminal narrowing. Left 
foramen is patent. Facet arthropathy. Stable. 
L2-L3: Loss of disc height and signal. Mild diffuse annular bulge. Moderate left 
lateral recess narrowing with mild right lateral recess narrowing and mild canal 
stenosis. Facet arthropathy. Mild right foraminal narrowing. Left foramen is 
also mildly narrowed. There is distortion of the exiting left L2 nerve root 
within the left neural foramen. Facet arthropathy. Stable. 
L3-L4: Loss of disc height posteriorly. Loss of disc signal. Mild diffuse 
annular bulge with mild left lateral recess narrowing. There is mild canal 
stenosis. Facet arthropathy. Patent right foramen. Left foramen is mildly 
narrowed. Stable. 
L4-L5: Posterior loss of disc height. Loss of disc signal. Mild left lateral 
recess narrowing. Small central disc protrusion. Small right facet joint 
effusion. Left-sided facet arthropathy. Foramina patent. Stable. 
L5-S1: Loss of disc height and signal. Mild to moderate left lateral recess 
narrowing. Canal otherwise patent. Mild diffuse annular bulge. Patent right 
foramen. Left foramen is mildly narrowed. Normal facets. Stable. 
-------------------------------------------------------------------------------- 
------
IMPRESSION: Ovoid lesion in the lateral right kidney is incompletely and suboptimally 
visualized. Would suggest follow-up renal ultrasound to better assess. 
Stable appearing degenerative and scoliotic changes. No significant or critical 
canal stenosis with variable multilevel lateral recess and foraminal narrowing 
as detailed above.
